# Patient Record
Sex: Male | Born: 1955 | Race: White | Hispanic: No | Marital: Married | Smoking: Former smoker
Health system: Southern US, Community
[De-identification: ages and names within clinical notes are randomized; demographics above are authoritative.]

## PROBLEM LIST (undated history)

## (undated) DIAGNOSIS — K219 Gastro-esophageal reflux disease without esophagitis: Secondary | ICD-10-CM

## (undated) DIAGNOSIS — Z8619 Personal history of other infectious and parasitic diseases: Secondary | ICD-10-CM

## (undated) DIAGNOSIS — M109 Gout, unspecified: Secondary | ICD-10-CM

## (undated) DIAGNOSIS — I10 Essential (primary) hypertension: Secondary | ICD-10-CM

## (undated) DIAGNOSIS — G5600 Carpal tunnel syndrome, unspecified upper limb: Secondary | ICD-10-CM

## (undated) DIAGNOSIS — E119 Type 2 diabetes mellitus without complications: Secondary | ICD-10-CM

## (undated) DIAGNOSIS — M1990 Unspecified osteoarthritis, unspecified site: Secondary | ICD-10-CM

## (undated) HISTORY — DX: Type 2 diabetes mellitus without complications: E11.9

## (undated) HISTORY — DX: Unspecified osteoarthritis, unspecified site: M19.90

## (undated) HISTORY — DX: Gastro-esophageal reflux disease without esophagitis: K21.9

## (undated) HISTORY — DX: Gout, unspecified: M10.9

## (undated) HISTORY — DX: Carpal tunnel syndrome, unspecified upper limb: G56.00

## (undated) HISTORY — DX: Essential (primary) hypertension: I10

## (undated) HISTORY — DX: Personal history of other infectious and parasitic diseases: Z86.19

---
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
## 1979-06-04 HISTORY — PX: OTHER SURGICAL HISTORY: SHX169

## 2001-06-03 DIAGNOSIS — I10 Essential (primary) hypertension: Secondary | ICD-10-CM | POA: Insufficient documentation

## 2007-10-22 DIAGNOSIS — K219 Gastro-esophageal reflux disease without esophagitis: Secondary | ICD-10-CM | POA: Insufficient documentation

## 2008-11-10 DIAGNOSIS — G5600 Carpal tunnel syndrome, unspecified upper limb: Secondary | ICD-10-CM

## 2008-11-10 HISTORY — DX: Carpal tunnel syndrome, unspecified upper limb: G56.00

## 2010-06-18 ENCOUNTER — Ambulatory Visit: Payer: Self-pay | Admitting: Family Medicine

## 2012-03-24 LAB — HM HEPATITIS C SCREENING LAB: HM Hepatitis Screen: NEGATIVE

## 2012-05-14 LAB — HM COLONOSCOPY

## 2013-06-02 ENCOUNTER — Ambulatory Visit: Payer: Self-pay | Admitting: Family Medicine

## 2013-06-03 ENCOUNTER — Ambulatory Visit: Payer: Self-pay | Admitting: Family Medicine

## 2014-05-12 LAB — HEPATIC FUNCTION PANEL
ALT: 26 U/L (ref 10–40)
AST: 22 U/L (ref 14–40)

## 2014-05-12 LAB — BASIC METABOLIC PANEL
BUN: 18 mg/dL (ref 4–21)
Creatinine: 1 mg/dL (ref 0.6–1.3)
Glucose: 101 mg/dL
Potassium: 4.3 mmol/L (ref 3.4–5.3)
Sodium: 140 mmol/L (ref 137–147)

## 2014-05-12 LAB — HEMOGLOBIN A1C: HEMOGLOBIN A1C: 5.8 % (ref 4.0–6.0)

## 2014-05-12 LAB — LIPID PANEL
Cholesterol: 157 mg/dL (ref 0–200)
HDL: 44 mg/dL (ref 35–70)
LDL Cholesterol: 89 mg/dL
Triglycerides: 122 mg/dL (ref 40–160)

## 2014-05-12 LAB — PSA: PSA: 0.9

## 2014-05-12 LAB — TSH: TSH: 2.37 u[IU]/mL (ref 0.41–5.90)

## 2014-12-08 ENCOUNTER — Other Ambulatory Visit: Payer: Self-pay | Admitting: Family Medicine

## 2015-04-05 ENCOUNTER — Other Ambulatory Visit: Payer: Self-pay | Admitting: Family Medicine

## 2015-05-12 DIAGNOSIS — E119 Type 2 diabetes mellitus without complications: Secondary | ICD-10-CM | POA: Insufficient documentation

## 2015-05-12 DIAGNOSIS — L918 Other hypertrophic disorders of the skin: Secondary | ICD-10-CM | POA: Insufficient documentation

## 2015-05-12 DIAGNOSIS — R634 Abnormal weight loss: Secondary | ICD-10-CM | POA: Insufficient documentation

## 2015-05-12 DIAGNOSIS — N529 Male erectile dysfunction, unspecified: Secondary | ICD-10-CM | POA: Insufficient documentation

## 2015-05-15 ENCOUNTER — Encounter: Payer: Self-pay | Admitting: Family Medicine

## 2015-05-15 ENCOUNTER — Ambulatory Visit (INDEPENDENT_AMBULATORY_CARE_PROVIDER_SITE_OTHER): Payer: 59 | Admitting: Family Medicine

## 2015-05-15 VITALS — BP 132/70 | HR 64 | Temp 98.9°F | Resp 16 | Ht 69.5 in | Wt 207.0 lb

## 2015-05-15 DIAGNOSIS — K219 Gastro-esophageal reflux disease without esophagitis: Secondary | ICD-10-CM | POA: Diagnosis not present

## 2015-05-15 DIAGNOSIS — E119 Type 2 diabetes mellitus without complications: Secondary | ICD-10-CM | POA: Diagnosis not present

## 2015-05-15 DIAGNOSIS — E669 Obesity, unspecified: Secondary | ICD-10-CM

## 2015-05-15 DIAGNOSIS — Z0000 Encounter for general adult medical examination without abnormal findings: Secondary | ICD-10-CM

## 2015-05-15 DIAGNOSIS — E785 Hyperlipidemia, unspecified: Secondary | ICD-10-CM | POA: Diagnosis not present

## 2015-05-15 DIAGNOSIS — I10 Essential (primary) hypertension: Secondary | ICD-10-CM

## 2015-05-15 LAB — POCT UA - MICROALBUMIN: MICROALBUMIN (UR) POC: 20 mg/L

## 2015-05-15 MED ORDER — METFORMIN HCL ER 500 MG PO TB24: 500.0000 mg | ORAL_TABLET | Freq: Every day | ORAL | Status: DC

## 2015-05-15 NOTE — Progress Notes (Signed)
Patient: Jeffery Norman, Male    DOB: March 29, 1956, 59 y.o.   MRN: [id number] Visit Date: 05/15/2015  Today's Provider: Mila Merry, MD   Chief Complaint  Patient presents with  . Annual Exam  . Diabetes    follow up  . Hypertension  . Hyperlipidemia   Subjective:    Annual physical exam Jeffery Norman is a 59 y.o. male who presents today for health maintenance and complete physical. He feels well. He reports exercising . He reports he is sleeping well.  -----------------------------------------------------------------  Diabetes Mellitus Type II, Follow-up:   Lab Results  Component Value Date   HGBA1C 5.8 05/12/2014   Last seen for diabetes 1 years ago.  Management since then includes no changes. He reports good compliance with treatment. He has cut back metformin to one a day  Since his blood sugars were so well controlled.  He is not having side effects.  Current symptoms include none and have been stable. Home blood sugar records: fasting range: 90-100   Episodes of hypoglycemia? no   Current Insulin Regimen: none Most Recent Eye Exam: 1 year Weight trend: decreasing steadily Prior visit with dietician: no Current diet: in general, a "healthy" diet   Current exercise: jogging  ------------------------------------------------------------------------   Hypertension, follow-up:  BP Readings from Last 3 Encounters:  05/12/14 138/80    He was last seen for hypertension 1 years ago.  BP at that visit was 138/80. Management since that visit includes no changes .He reports good compliance with treatment. He is not having side effects.  He is exercising. He is not adherent to low salt diet.   Outside blood pressures are not being checked. He is experiencing none.  Patient denies chest pain, chest pressure/discomfort, claudication, dyspnea, exertional chest pressure/discomfort, fatigue, irregular heart beat, lower extremity edema, near-syncope,  orthopnea, paroxysmal nocturnal dyspnea, syncope and tachypnea.   Cardiovascular risk factors include advanced age (older than 76 for men, 32 for women), diabetes mellitus, dyslipidemia, hypertension and male gender.  Use of agents associated with hypertension: NSAIDS.   ------------------------------------------------------------------------    Lipid/Cholesterol, Follow-up:   Last seen for this 3 months ago.  Management since that visit includes no changes.  Last Lipid Panel:    Component Value Date/Time   CHOL 157 05/12/2014   TRIG 122 05/12/2014   HDL 44 05/12/2014   LDLCALC 89 05/12/2014    He reports good compliance with treatment. He is not having side effects.   Wt Readings from Last 3 Encounters:  05/12/14 224 lb (101.606 kg)    ------------------------------------------------------------------------   GERD, Follow up:  He states he has stopped taking omeprazole since symptoms have mostly resolved with weight loss. He does have mild heartburn once or twice a month.He usually takes a tums and symptoms resolve.   ------------------------------------------------------------------------     Review of Systems  Constitutional: Negative for fever, chills, appetite change and fatigue.  HENT: Negative for congestion, ear pain, hearing loss, nosebleeds and trouble swallowing.   Eyes: Negative for pain and visual disturbance.  Respiratory: Negative for cough, chest tightness, shortness of breath and wheezing.   Cardiovascular: Negative for chest pain, palpitations and leg swelling.  Gastrointestinal: Negative for nausea, vomiting, abdominal pain, diarrhea, constipation and blood in stool.  Endocrine: Negative for polydipsia, polyphagia and polyuria.  Genitourinary: Negative for dysuria and flank pain.  Musculoskeletal: Negative for myalgias, back pain, joint swelling, arthralgias and neck stiffness.  Skin: Negative for color change, rash and wound.  Neurological:  Negative for dizziness, tremors, seizures, speech difficulty, weakness, light-headedness and headaches.  Psychiatric/Behavioral: Negative for behavioral problems, confusion, sleep disturbance, dysphoric mood and decreased concentration. The patient is not nervous/anxious.   All other systems reviewed and are negative.   Social History      He  reports that he quit smoking about 3 years ago. His smoking use included Cigarettes. He has a 12.5 pack-year smoking history. He has never used smokeless tobacco. He reports that he drinks alcohol. He reports that he does not use illicit drugs.       Social History   Social History  . Marital Status: Married    Spouse Name: N/A  . Number of Children: 1  . Years of Education: N/A   Occupational History  . Machinist    Social History Main Topics  . Smoking status: Former Smoker -- 0.50 packs/day for 25 years    Types: Cigarettes    Quit date: 08/02/2011  . Smokeless tobacco: Never Used  . Alcohol Use: 0.0 oz/week    0 Standard drinks or equivalent per week     Comment: moderate use  . Drug Use: No  . Sexual Activity: Not Asked   Other Topics Concern  . None   Social History Narrative    Patient Active Problem List   Diagnosis Date Noted  . Diabetes mellitus (HCC) 05/12/2015  . 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 Fatigue 05/12/2015  . Obesity 05/12/2015  . Skin tag 05/12/2015  . Weight loss 05/12/2015  . Carpal tunnel syndrome 11/10/2008  . GERD (gastroesophageal reflux disease) 10/22/2007  . Compulsive tobacco user syndrome 10/22/2007  . Gout 02/03/2007  . HLD (hyperlipidemia) 09/24/2005  . Essential (primary) hypertension 06/03/2001    Past Surgical History  Procedure Laterality Date  . Index finger surgery  1981  . Appendectomy  1960's    Family History        Family Status  Relation Status Death Age  . Mother Deceased 4670    septic shock  . Father Deceased 1632    accidental  . Brother Alive    . Son Alive   . Maternal Grandmother Deceased 8090's        His family history includes Diabetes in his brother, mother, and other; Heart attack in his maternal grandmother; Hypertension in his mother.    No Known Allergies  Previous Medications   ASPIRIN 81 MG TABLET    Take 1 tablet by mouth daily.   ATORVASTATIN (LIPITOR) 40 MG TABLET    Take 1 tablet by mouth daily.   BISOPROLOL (ZEBETA) 10 MG TABLET    TAKE 1 TABLET BY MOUTH ONCE DAILY   GLUCOSE BLOOD (BAYER CONTOUR NEXT TEST) TEST STRIP    1 strip 2 (two) times daily.   METFORMIN (GLUCOPHAGE-XR) 500 MG 24 HR TABLET    TAKE 1 TABLET BY MOUTH DAILY   OMEGA-3 FATTY ACIDS (FISH OIL) 1000 MG CAPS    Take 2 capsules by mouth daily.   SILDENAFIL (VIAGRA) 100 MG TABLET    Take 1 tablet by mouth as needed.    Patient Care Team: Malva Limesonald E Eliyanna Ault, MD as PCP - General (Family Medicine)     Objective:   Vitals: BP 132/70 mmHg  Pulse 64  Temp(Src) 98.9 F (37.2 C) (Oral)  Resp 16  Ht 5' 9.5" (1.765 m)  Wt 207 lb (93.895 kg)  BMI 30.14 kg/m2   Physical Exam   General Appearance:  Alert, cooperative, no distress, appears stated age, overweight  Head:    Normocephalic, without obvious abnormality, atraumatic  Eyes:    PERRL, conjunctiva/corneas clear, EOM's intact, fundi    benign, both eyes       Ears:    Normal TM's and external ear canals, both ears  Nose:   Nares normal, septum midline, mucosa normal, no drainage   or sinus tenderness  Throat:   Lips, mucosa, and tongue normal; teeth and gums normal  Neck:   Supple, symmetrical, trachea midline, no adenopathy;       thyroid:  No enlargement/tenderness/nodules; no carotid   bruit or JVD  Back:     Symmetric, no curvature, ROM normal, no CVA tenderness  Lungs:     Clear to auscultation bilaterally, respirations unlabored  Chest wall:    No tenderness or deformity  Heart:    Regular rate and rhythm, S1 and S2 normal, no murmur, rub   or gallop  Abdomen:     Soft, non-tender,  bowel sounds active all four quadrants,    no masses, no organomegaly  Genitalia:    deferred  Rectal:    deferred  Extremities:   Extremities normal, atraumatic, no cyanosis or edema  Pulses:   2+ and symmetric all extremities  Skin:   Skin color, texture, turgor normal, no rashes or lesions  Lymph nodes:   Cervical, supraclavicular, and axillary nodes normal  Neurologic:   CNII-XII intact. Normal strength, sensation and reflexes      throughout      Depression Screen PHQ 2/9 Scores 05/15/2015  PHQ - 2 Score 0  PHQ- 9 Score 1   Results for orders placed or performed in visit on 05/15/15  POCT UA - Microalbumin  Result Value Ref Range   Microalbumin Ur, POC 20 mg/L   Creatinine, POC n/a mg/dL   Albumin/Creatinine Ratio, Urine, POC n/a       Assessment & Plan:     Routine Health Maintenance and Physical Exam  Exercise Activities and Dietary recommendations Goals    None      Immunization History  Administered Date(s) Administered  . Td 03/28/1997  . Tdap 10/22/2007        Discussed health benefits of physical activity, and encouraged him to engage in regular exercise appropriate for his age and condition.    --------------------------------------------------------------------  1. Annual physical exam Doing. Counseled he is due for routine eye exam  - Comprehensive metabolic panel - PSA  2. Controlled type 2 diabetes mellitus without complication, without long-term current use of insulin (HCC) Doing well, has cut back to one metformin daily. Check a1c.  - POCT UA - Microalbumin - Hemoglobin A1c  3. HLD (hyperlipidemia) He is tolerating atorvastatin well with no adverse effects.   - Lipid panel  4. Essential (primary) hypertension Well controlled.  Continue current medications.   - EKG 12-Lead  5. Obesity Doing well with healthy diet and exercise.   6. Gastroesophageal reflux disease without esophagitis Well controlled with diet alone. Is off of  PPI. Advised to restart if he starts having sx more than twice a week.

## 2015-05-16 LAB — COMPREHENSIVE METABOLIC PANEL
ALK PHOS: 78 IU/L (ref 39–117)
ALT: 22 IU/L (ref 0–44)
AST: 26 IU/L (ref 0–40)
Albumin/Globulin Ratio: 1.7 (ref 1.1–2.5)
Albumin: 4.4 g/dL (ref 3.5–5.5)
BILIRUBIN TOTAL: 0.6 mg/dL (ref 0.0–1.2)
BUN / CREAT RATIO: 20 (ref 9–20)
BUN: 19 mg/dL (ref 6–24)
CHLORIDE: 99 mmol/L (ref 96–106)
CO2: 26 mmol/L (ref 18–29)
CREATININE: 0.96 mg/dL (ref 0.76–1.27)
Calcium: 9.5 mg/dL (ref 8.7–10.2)
GFR calc Af Amer: 101 mL/min/{1.73_m2} (ref >59)
GFR calc non Af Amer: 87 mL/min/{1.73_m2} (ref >59)
Globulin, Total: 2.6 g/dL (ref 1.5–4.5)
Glucose: 111 mg/dL — ABNORMAL HIGH (ref 65–99)
Potassium: 4.6 mmol/L (ref 3.5–5.2)
SODIUM: 139 mmol/L (ref 134–144)
Total Protein: 7 g/dL (ref 6.0–8.5)

## 2015-05-16 LAB — HEMOGLOBIN A1C
Est. average glucose Bld gHb Est-mCnc: 117 mg/dL
HEMOGLOBIN A1C: 5.7 % — AB (ref 4.8–5.6)

## 2015-05-16 LAB — LIPID PANEL
CHOL/HDL RATIO: 2.6 ratio (ref 0.0–5.0)
CHOLESTEROL TOTAL: 166 mg/dL (ref 100–199)
HDL: 63 mg/dL (ref >39)
LDL Calculated: 92 mg/dL (ref 0–99)
TRIGLYCERIDES: 54 mg/dL (ref 0–149)
VLDL Cholesterol Cal: 11 mg/dL (ref 5–40)

## 2015-05-16 LAB — PSA: PROSTATE SPECIFIC AG, SERUM: 1 ng/mL (ref 0.0–4.0)

## 2015-06-02 ENCOUNTER — Other Ambulatory Visit: Payer: Self-pay | Admitting: Family Medicine

## 2015-06-30 ENCOUNTER — Other Ambulatory Visit: Payer: Self-pay | Admitting: Family Medicine

## 2015-07-10 ENCOUNTER — Other Ambulatory Visit: Payer: Self-pay | Admitting: Family Medicine

## 2015-09-21 ENCOUNTER — Other Ambulatory Visit: Payer: Self-pay | Admitting: Family Medicine

## 2015-10-07 ENCOUNTER — Other Ambulatory Visit: Payer: Self-pay | Admitting: Family Medicine

## 2015-12-22 ENCOUNTER — Other Ambulatory Visit: Payer: Self-pay | Admitting: Family Medicine

## 2016-01-18 ENCOUNTER — Other Ambulatory Visit: Payer: Self-pay | Admitting: Family Medicine

## 2016-02-01 ENCOUNTER — Other Ambulatory Visit: Payer: Self-pay | Admitting: Family Medicine

## 2016-05-20 ENCOUNTER — Ambulatory Visit (INDEPENDENT_AMBULATORY_CARE_PROVIDER_SITE_OTHER): Payer: 59 | Admitting: Family Medicine

## 2016-05-20 ENCOUNTER — Encounter: Payer: Self-pay | Admitting: Family Medicine

## 2016-05-20 VITALS — BP 120/72 | HR 60 | Temp 98.8°F | Resp 16 | Ht 70.0 in | Wt 230.0 lb

## 2016-05-20 DIAGNOSIS — I10 Essential (primary) hypertension: Secondary | ICD-10-CM

## 2016-05-20 DIAGNOSIS — Z6833 Body mass index (BMI) 33.0-33.9, adult: Secondary | ICD-10-CM

## 2016-05-20 DIAGNOSIS — E785 Hyperlipidemia, unspecified: Secondary | ICD-10-CM

## 2016-05-20 DIAGNOSIS — Z125 Encounter for screening for malignant neoplasm of prostate: Secondary | ICD-10-CM | POA: Diagnosis not present

## 2016-05-20 DIAGNOSIS — Z0001 Encounter for general adult medical examination with abnormal findings: Secondary | ICD-10-CM | POA: Diagnosis not present

## 2016-05-20 DIAGNOSIS — E669 Obesity, unspecified: Secondary | ICD-10-CM

## 2016-05-20 DIAGNOSIS — Z0000 Encounter for general adult medical examination without abnormal findings: Secondary | ICD-10-CM

## 2016-05-20 DIAGNOSIS — N529 Male erectile dysfunction, unspecified: Secondary | ICD-10-CM

## 2016-05-20 DIAGNOSIS — E118 Type 2 diabetes mellitus with unspecified complications: Secondary | ICD-10-CM | POA: Diagnosis not present

## 2016-05-20 LAB — POCT UA - MICROALBUMIN: MICROALBUMIN (UR) POC: 50 mg/L

## 2016-05-20 MED ORDER — SILDENAFIL CITRATE 20 MG PO TABS: ORAL_TABLET | ORAL | 3 refills | Status: DC

## 2016-05-20 NOTE — Progress Notes (Signed)
Patient: Jeffery Norman, Male    DOB: 1955-07-13, 60 y.o.   MRN: [id number] Visit Date: 05/20/2016  Today's Provider: Mila Merryonald Donella Pascarella, MD   Chief Complaint  Patient presents with  . Annual Exam  . Diabetes  . Hypertension  . Hyperlipidemia   Subjective:    Annual physical exam Jeffery Norman is a 60 y.o. male who presents today for health maintenance and complete physical. He feels fairly well. He reports exercising 3 times weekly . He reports he is sleeping well. HHas no new complaints today.   Colonoscopy- 05/14/2012. Diverticulosis. No polyps.  Tdap- 10/22/2007.     Diabetes Mellitus Type II, Follow-up:   Lab Results  Component Value Date   HGBA1C 5.7 (H) 05/15/2015   HGBA1C 5.8 05/12/2014    Last seen for diabetes 1 years ago.  Management since then includes no changes. He reports good compliance with treatment. He is not having side effects.  Current symptoms include none and have been stable. Home blood sugar records: fasting range: 115  Episodes of hypoglycemia? no   Current Insulin Regimen: none Most Recent Eye Exam: due Weight trend: increasing steadily Prior visit with dietician: no Current diet: well balanced Current exercise: walking  Pertinent Labs:    Component Value Date/Time   CHOL 166 05/15/2015 1003   TRIG 54 05/15/2015 1003   HDL 63 05/15/2015 1003   LDLCALC 92 05/15/2015 1003   CREATININE 0.96 05/15/2015 1003    Wt Readings from Last 3 Encounters:  05/20/16 230 lb (104.3 kg)  05/15/15 207 lb (93.9 kg)  05/12/14 224 lb (101.6 kg)    Hypertension, follow-up:  BP Readings from Last 3 Encounters:  05/20/16 120/72  05/15/15 132/70  05/12/14 138/80    He was last seen for hypertension 1 years ago.  BP at that visit was 132/70. Management since that visit includes no changes. He reports good compliance with treatment. He is not having side effects.  He is exercising. He is adherent to low salt diet.   Outside blood  pressures are not being checked. Patient denies exertional chest pressure/discomfort, lower extremity edema, palpitations and syncope.   Cardiovascular risk factors include diabetes mellitus and dyslipidemia.     Weight trend: increasing steadily  Wt Readings from Last 3 Encounters:  05/20/16 230 lb (104.3 kg)  05/15/15 207 lb (93.9 kg)  05/12/14 224 lb (101.6 kg)    Current diet: well balanced    Lipid/Cholesterol, Follow-up:   Last seen for this1 years ago.  Management changes since that visit include no changes. . Last Lipid Panel:    Component Value Date/Time   CHOL 166 05/15/2015 1003   TRIG 54 05/15/2015 1003   HDL 63 05/15/2015 1003   CHOLHDL 2.6 05/15/2015 1003   LDLCALC 92 05/15/2015 1003    Risk factors for vascular disease include diabetes mellitus and hypertension  He reports good compliance with treatment. He is not having side effects.  Current symptoms include none and have been stable. Weight trend: stable Prior visit with dietician: no Current diet: well balanced Current exercise: walking  Wt Readings from Last 3 Encounters:  05/20/16 230 lb (104.3 kg)  05/15/15 207 lb (93.9 kg)  05/12/14 224 lb (101.6 kg)     He requests prescription today for generic sildenafil. Has done well with Viagra with no adverse side effects.   Review of Systems  Constitutional: Negative.   HENT: Negative.   Eyes: Negative.   Respiratory: Negative.  Cardiovascular: Negative.   Gastrointestinal: Negative.   Endocrine: Negative.   Genitourinary: Negative.   Musculoskeletal: Negative.   Skin: Negative.   Allergic/Immunologic: Negative.   Neurological: Negative.   Hematological: Negative.   Psychiatric/Behavioral: Negative.     Social History      He  reports that he quit smoking about 4 years ago. His smoking use included Cigarettes. He has a 12.50 pack-year smoking history. He has never used smokeless tobacco. He reports that he drinks alcohol. He reports  that he does not use drugs.       Social History   Social History  . Marital status: Married    Spouse name: N/A  . Number of children: 1  . Years of education: N/A   Occupational History  . Machinist    Social History Main Topics  . Smoking status: Former Smoker    Packs/day: 0.50    Years: 25.00    Types: Cigarettes    Quit date: 08/02/2011  . Smokeless tobacco: Never Used  . Alcohol use 0.0 oz/week     Comment: moderate use  . Drug use: No  . Sexual activity: Not Asked   Other Topics Concern  . None   Social History Narrative  . None    Past Medical History:  Diagnosis Date  . Carpal tunnel syndrome 11/10/2008  . Gout   . History of chicken pox   . History of measles   . History of mumps      Patient Active Problem List   Diagnosis Date Noted  . Diabetes mellitus (HCC) 05/12/2015  . 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 Obesity 05/12/2015  . Skin tag 05/12/2015  . Weight loss 05/12/2015  . GERD (gastroesophageal reflux disease) 10/22/2007  . History of tobacco use 10/22/2007  . Gout 02/03/2007  . HLD (hyperlipidemia) 09/24/2005  . Essential (primary) hypertension 06/03/2001    Past Surgical History:  Procedure Laterality Date  . APPENDECTOMY  1960's  . index finger surgery  1981    Family History        Family Status  Relation Status  . Mother Deceased at age 58   septic shock  . Father Deceased at age 58   accidental  . Brother Alive  . Son Alive  . Maternal Grandmother Deceased at age 58's  . Other         His family history includes Diabetes in his brother, mother, and other; Heart attack in his maternal grandmother; Hypertension in his mother.     No Known Allergies   Current Outpatient Prescriptions:  .  aspirin 81 MG tablet, Take 1 tablet by mouth daily., Disp: , Rfl:  .  atorvastatin (LIPITOR) 40 MG tablet, TAKE 1 TABLET BY MOUTH EVERY DAY(NEEDS APPT), Disp: 30 tablet, Rfl: 5 .  bisoprolol (ZEBETA) 10 MG  tablet, TAKE 1 TABLET BY MOUTH ONCE DAILY, Disp: 30 tablet, Rfl: 3 .  glucose blood (BAYER CONTOUR NEXT TEST) test strip, 1 strip daily. , Disp: , Rfl:  .  metFORMIN (GLUCOPHAGE-XR) 500 MG 24 hr tablet, TAKE 1 TABLET BY MOUTH TWICE DAILY, Disp: 60 tablet, Rfl: 5 .  Omega-3 Fatty Acids (FISH OIL) 1000 MG CAPS, Take 2 capsules by mouth daily., Disp: , Rfl:  .  omeprazole (PRILOSEC) 40 MG capsule, TAKE 1 CAPSULE BY MOUTH DAILY, Disp: 30 capsule, Rfl: 3 .  VIAGRA 100 MG tablet, TAKE 1 TABLET BY MOUTH AS NEEDED. NO MORE THAN ONE IN A DAY, Disp: 10  tablet, Rfl: 5   Patient Care Team: Malva Limes, MD as PCP - General (Family Medicine)      Objective:   Vitals: BP 120/72 (BP Location: Left Arm, Patient Position: Sitting, Cuff Size: Normal)   Pulse 60   Temp 98.8 F (37.1 C)   Resp 16   Ht 5\' 10"  (1.778 m)   Wt 230 lb (104.3 kg)   SpO2 98%   BMI 33.00 kg/m    Physical Exam   Depression Screen PHQ 2/9 Scores 05/15/2015  PHQ - 2 Score 0  PHQ- 9 Score 1      Assessment & Plan:     Routine Health Maintenance and Physical Exam  Exercise Activities and Dietary recommendations Goals    None      Immunization History  Administered Date(s) Administered  . Td 03/28/1997  . Tdap 10/22/2007    Health Maintenance  Topic Date Due  . PNEUMOCOCCAL POLYSACCHARIDE VACCINE (1) 03/02/1960  . FOOT EXAM  03/02/1968  . OPHTHALMOLOGY EXAM  03/02/1968  . HIV Screening  03/02/1973  . HEMOGLOBIN A1C  11/13/2015  . INFLUENZA VACCINE  01/02/2016  . URINE MICROALBUMIN  05/14/2016  . TETANUS/TDAP  10/21/2017  . COLONOSCOPY  05/14/2022  . Hepatitis C Screening  Completed     Discussed health benefits of physical activity, and encouraged him to engage in regular exercise appropriate for his age and condition.   1. Annual physical exam Generally doing well with normal except for obesity.  - Comprehensive metabolic panel - Lipid panel - TSH  2. Essential (primary)  hypertension Well controlled.  Continue current medications.    3. Type 2 diabetes mellitus with complication, unspecified long term insulin use status (HCC) Doing well with metformin - Hemoglobin A1c - POCT UA - Microalbumin  4. ED (erectile dysfunction) of organic origin Change from Viagra to generic sildenafil 20mg  due to cost.  5. Hyperlipidemia, unspecified hyprlipidemia type He is tolerating atorvastatin and Zetia. well with no adverse effects.   - Lipid panel - TSH  6. Prostate cancer screening  - PSA  7. Class 1 obesity without serious comorbidity with body mass index (BMI) of 33.0 to 33.9 in adult, unspecified obesity type Diet and exercise.      The entirety of the information documented in the History of Present Illness, Review of Systems and Physical Exam were personally obtained by me. Portions of this information were initially documented by Anson [place], CMA and reviewed by me for thoroughness and accuracy.    Mila Merry, MD  [facility]

## 2016-05-21 ENCOUNTER — Other Ambulatory Visit: Payer: Self-pay | Admitting: Family Medicine

## 2016-05-21 DIAGNOSIS — R972 Elevated prostate specific antigen [PSA]: Secondary | ICD-10-CM | POA: Insufficient documentation

## 2016-05-21 LAB — COMPREHENSIVE METABOLIC PANEL
A/G RATIO: 1.5 (ref 1.2–2.2)
ALT: 23 IU/L (ref 0–44)
AST: 24 IU/L (ref 0–40)
Albumin: 4.4 g/dL (ref 3.5–5.5)
Alkaline Phosphatase: 78 IU/L (ref 39–117)
BUN/Creatinine Ratio: 18 (ref 9–20)
BUN: 17 mg/dL (ref 6–24)
Bilirubin Total: 0.5 mg/dL (ref 0.0–1.2)
CALCIUM: 9.1 mg/dL (ref 8.7–10.2)
CO2: 26 mmol/L (ref 18–29)
Chloride: 101 mmol/L (ref 96–106)
Creatinine, Ser: 0.93 mg/dL (ref 0.76–1.27)
GFR, EST AFRICAN AMERICAN: 104 mL/min/{1.73_m2} (ref >59)
GFR, EST NON AFRICAN AMERICAN: 90 mL/min/{1.73_m2} (ref >59)
Globulin, Total: 2.9 g/dL (ref 1.5–4.5)
Glucose: 101 mg/dL — ABNORMAL HIGH (ref 65–99)
POTASSIUM: 4.8 mmol/L (ref 3.5–5.2)
Sodium: 142 mmol/L (ref 134–144)
TOTAL PROTEIN: 7.3 g/dL (ref 6.0–8.5)

## 2016-05-21 LAB — LIPID PANEL
CHOL/HDL RATIO: 3.3 ratio (ref 0.0–5.0)
Cholesterol, Total: 154 mg/dL (ref 100–199)
HDL: 46 mg/dL (ref >39)
LDL Calculated: 90 mg/dL (ref 0–99)
Triglycerides: 92 mg/dL (ref 0–149)
VLDL CHOLESTEROL CAL: 18 mg/dL (ref 5–40)

## 2016-05-21 LAB — PSA: PROSTATE SPECIFIC AG, SERUM: 5.7 ng/mL — AB (ref 0.0–4.0)

## 2016-05-21 LAB — TSH: TSH: 1.86 u[IU]/mL (ref 0.450–4.500)

## 2016-05-21 LAB — HEMOGLOBIN A1C
Est. average glucose Bld gHb Est-mCnc: 114 mg/dL
HEMOGLOBIN A1C: 5.6 % (ref 4.8–5.6)

## 2016-06-02 ENCOUNTER — Other Ambulatory Visit: Payer: Self-pay | Admitting: Family Medicine

## 2016-06-27 ENCOUNTER — Telehealth: Payer: Self-pay | Admitting: Family Medicine

## 2016-06-27 DIAGNOSIS — R972 Elevated prostate specific antigen [PSA]: Secondary | ICD-10-CM

## 2016-06-27 NOTE — Telephone Encounter (Signed)
Please advise 

## 2016-06-27 NOTE — Telephone Encounter (Signed)
Patient notified lab slip is ready to pick-up.  

## 2016-06-27 NOTE — Telephone Encounter (Signed)
Future order is already in epic. Please release for patient to pick up.

## 2016-06-27 NOTE — Telephone Encounter (Signed)
Pt wife called to request a lab slip for pt to have his PSA retested.  [id number]#[phone]/WJCB#[phone]/MW

## 2016-06-28 LAB — PSA, TOTAL AND FREE
PROSTATE SPECIFIC AG, SERUM: 3.5 ng/mL (ref 0.0–4.0)
PSA, Free Pct: 9.4 %
PSA, Free: 0.33 ng/mL

## 2016-07-24 ENCOUNTER — Other Ambulatory Visit: Payer: Self-pay | Admitting: Family Medicine

## 2016-09-20 ENCOUNTER — Other Ambulatory Visit: Payer: Self-pay | Admitting: Specialist

## 2016-09-23 ENCOUNTER — Ambulatory Visit (INDEPENDENT_AMBULATORY_CARE_PROVIDER_SITE_OTHER): Payer: Managed Care, Other (non HMO) | Admitting: Family Medicine

## 2016-09-23 ENCOUNTER — Encounter: Payer: Self-pay | Admitting: Family Medicine

## 2016-09-23 VITALS — BP 140/82 | HR 52 | Temp 98.6°F | Resp 16 | Ht 70.0 in | Wt 242.0 lb

## 2016-09-23 DIAGNOSIS — E118 Type 2 diabetes mellitus with unspecified complications: Secondary | ICD-10-CM

## 2016-09-23 DIAGNOSIS — I10 Essential (primary) hypertension: Secondary | ICD-10-CM | POA: Diagnosis not present

## 2016-09-23 DIAGNOSIS — R972 Elevated prostate specific antigen [PSA]: Secondary | ICD-10-CM | POA: Diagnosis not present

## 2016-09-23 DIAGNOSIS — Z01818 Encounter for other preprocedural examination: Secondary | ICD-10-CM | POA: Diagnosis not present

## 2016-09-23 NOTE — Progress Notes (Signed)
Patient: Jeffery Norman Male    DOB: 04/12/1956   61 y.o.   MRN: [id number] Visit Date: 09/23/2016  Today's Provider: Mila Merry, MD   Chief Complaint  Patient presents with  . Medical Clearance    Surgical clearance   Subjective:    HPI Surgical Clearance:  Patient is scheduled to have Left Carpal Tunnel surgery on 10/02/2016. Surgeon will be Dr. Deeann Saint. Patient feels well today. He has no chest pain, palpitations, dizziness, orthopnea, or dyspnea on exertion.  Past Surgical History:  Procedure Laterality Date  . APPENDECTOMY  1960's  . index finger surgery  1981   Has no personal or family history of adverse reaction to anesthesia or significant surgical complications.   Patient Active Problem List   Diagnosis Date Noted  . Elevated PSA 05/21/2016  . Diabetes mellitus (HCC) 05/12/2015  . 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 Obesity 05/12/2015  . Skin tag 05/12/2015  . Weight loss 05/12/2015  . GERD (gastroesophageal reflux disease) 10/22/2007  . History of tobacco use 10/22/2007  . Gout 02/03/2007  . HLD (hyperlipidemia) 09/24/2005  . Essential (primary) hypertension 06/03/2001   . Past Medical History:  Diagnosis Date  . Carpal tunnel syndrome 11/10/2008  . Gout   . History of chicken pox   . History of measles   . History of mumps        No Known Allergies   Current Outpatient Prescriptions:  .  aspirin 81 MG tablet, Take 1 tablet by mouth daily., Disp: , Rfl:  .  atorvastatin (LIPITOR) 40 MG tablet, TAKE 1 TABLET BY MOUTH EVERY DAY(NEEDS APPT), Disp: 30 tablet, Rfl: 5 .  bisoprolol (ZEBETA) 10 MG tablet, TAKE 1 TABLET BY MOUTH ONCE DAILY, Disp: 30 tablet, Rfl: 5 .  glucose blood (BAYER CONTOUR NEXT TEST) test strip, 1 strip daily. , Disp: , Rfl:  .  metFORMIN (GLUCOPHAGE-XR) 500 MG 24 hr tablet, TAKE 1 TABLET BY MOUTH TWICE DAILY, Disp: 60 tablet, Rfl: 5 .  Omega-3 Fatty Acids (FISH OIL) 1000 MG CAPS, Take 2  capsules by mouth daily., Disp: , Rfl:  .  omeprazole (PRILOSEC) 40 MG capsule, TAKE 1 CAPSULE BY MOUTH DAILY, Disp: 30 capsule, Rfl: 5 .  sildenafil (REVATIO) 20 MG tablet, 3-5 tablets as needed prior to intercourse, not to exceed 5 tablets in a day, Disp: 50 tablet, Rfl: 3 .  meloxicam (MOBIC) 15 MG tablet, Take 1 tablet by mouth daily., Disp: , Rfl:   Review of Systems  Constitutional: Negative for appetite change, chills and fever.  Respiratory: Negative for chest tightness, shortness of breath and wheezing.   Cardiovascular: Negative for chest pain and palpitations.  Gastrointestinal: Negative for abdominal pain, nausea and vomiting.  Musculoskeletal: Positive for arthralgias (left hand pain).    Social History  Substance Use Topics  . Smoking status: Former Smoker    Packs/day: 0.50    Years: 25.00    Types: Cigarettes    Quit date: 08/02/2011  . Smokeless tobacco: Never Used  . Alcohol use 0.0 oz/week     Comment: moderate use   Objective:   BP 140/82 (BP Location: Left Arm, Patient Position: Sitting, Cuff Size: Large)   Pulse (!) 52   Temp 98.6 F (37 C) (Oral)   Resp 16   Ht  (1.778 m)   Wt 242 lb (109.8 kg)   SpO2 97% Comment: room air  BMI 34.72 kg/m  Physical Exam   General Appearance:    Alert, cooperative, no distress  Eyes:    PERRL, conjunctiva/corneas clear, EOM's intact       Lungs:     Clear to auscultation bilaterally, respirations unlabored  Heart:    Regular rate and rhythm, nor mrg.   Neurologic:   Awake, alert, oriented x 3. No apparent focal neurological           defect.       Results for orders placed or performed in visit on 09/23/16  Hemoglobin A1c  Result Value Ref Range   Hgb A1c MFr Bld 5.8 (H) 4.8 - 5.6 %   Est. average glucose Bld gHb Est-mCnc 120 mg/dL  PSA Total (Reflex To Free)  Result Value Ref Range   Prostate Specific Ag, Serum 2.8 0.0 - 4.0 ng/mL   Reflex Criteria Comment         Assessment & Plan:     1.  Pre-op evaluation Risk factors for cardiac disease include well controlled diabetes, hypertension, and hyperlipemia. These are well controlled  And he is currently at low risk for cardiac and pulmonary complications of surgery and anesthesia. He may stop aspirin 5 days before surgery and resume the day following surgery. No additional precautions are indicated.  - EKG 12-Lead  2. Essential (primary) hypertension Well controlled.  Continue current medications.   - EKG 12-Lead  3. Elevated PSA  - PSA Total (Reflex To Free)  4. Type 2 diabetes mellitus with complication, unspecified whether long term insulin use (HCC) Well controlled.  Continue current medications.   - Hemoglobin A1c - EKG 12-Lead       Mila Merry, MD  [facility]

## 2016-09-24 LAB — PSA TOTAL (REFLEX TO FREE): PROSTATE SPECIFIC AG, SERUM: 2.8 ng/mL (ref 0.0–4.0)

## 2016-09-24 LAB — HEMOGLOBIN A1C
ESTIMATED AVERAGE GLUCOSE: 120 mg/dL
HEMOGLOBIN A1C: 5.8 % — AB (ref 4.8–5.6)

## 2016-09-24 NOTE — Progress Notes (Signed)
Advised  ED 

## 2016-09-26 ENCOUNTER — Encounter: Payer: Self-pay | Admitting: *Deleted

## 2016-09-26 ENCOUNTER — Encounter
Admission: RE | Admit: 2016-09-26 | Discharge: 2016-09-26 | Disposition: A | Discharge status: Home / Self Care | Payer: Managed Care, Other (non HMO) | Source: Ambulatory Visit | Attending: Specialist | Admitting: Specialist

## 2016-09-26 NOTE — Patient Instructions (Signed)
  Your procedure is scheduled on: 10-02-16  Report to Same Day Surgery 2nd floor medical mall [facility] Entrance-take elevator on left to 2nd floor.  Check in with surgery information desk.) To find out your arrival time please call [phone] between 1PM - 3PM on 10-01-16  Remember: Instructions that are not followed completely may result in serious medical risk, up to and including death, or upon the discretion of your surgeon and anesthesiologist your surgery may need to be rescheduled.    _x___ 1. Do not eat food or drink liquids after midnight. No gum chewing or  hard candies.     __x__ 2. No Alcohol for 24 hours before or after surgery.   __x__3. No Smoking for 24 prior to surgery.   ____  4. Bring all medications with you on the day of surgery if instructed.    __x__ 5. Notify your doctor if there is any change in your medical condition     (cold, fever, infections).     Do not wear jewelry, make-up, hairpins, clips or nail polish.  Do not wear lotions, powders, or perfumes. You may wear deodorant.  Do not shave 48 hours prior to surgery. Men may shave face and neck.  Do not bring valuables to the hospital.    [facility] is not responsible for any belongings or valuables.               Contacts, dentures or bridgework may not be worn into surgery.  Leave your suitcase in the car. After surgery it may be brought to your room.  For patients admitted to the hospital, discharge time is determined by your treatment team.   Patients discharged the day of surgery will not be allowed to drive home.  You will need someone to drive you home and stay with you the night of your procedure.    Please read over the following fact sheets that you were given:   [facility] Preparing for Surgery and or MRSA Information   _x___ Take anti-hypertensive (unless it includes a diuretic), cardiac, seizure, asthma,     anti-reflux and psychiatric medicines. These include:  1. BISOPROLOL  2.  PRILOSEC  3. TAKE AN EXTRA PRILOSEC ON Tuesday NIGHT BEFORE BED  4.  5.  6.  ____Fleets enema or Magnesium Citrate as directed.   ____ Use CHG Soap or sage wipes as directed on instruction sheet   ____ Use inhalers on the day of surgery and bring to hospital day of surgery  _X___ Stop Metformin and Janumet 2 days prior to surgery-LAST DOSE OF METFORMIN ON Sunday, April 29TH    ____ Take 1/2 of usual insulin dose the night before surgery and none on the morning     surgery.   _x___ Follow recommendations from Cardiologist, Pulmonologist or PCP regarding stopping Aspirin, Coumadin, Pllavix ,Eliquis, Effient, or Pradaxa, and Pletal-STOP ASPIRIN NOW  X____Stop Anti-inflammatories such as Advil, Aleve, Ibuprofen, Motrin, Naproxen,MELOXICAM Naprosyn, Goodies powders or aspirin products NOW-OK to take Tylenol    _x___ Stop supplements until after surgery-STOP FISH OIL NOW   ____ Bring C-Pap to the hospital.

## 2016-10-01 MED ORDER — CLINDAMYCIN PHOSPHATE 600 MG/50ML IV SOLN
600.0000 mg | Freq: Once | INTRAVENOUS | Status: AC
  Administered 2016-10-02: 600 mg via INTRAVENOUS

## 2016-10-01 MED ORDER — CEFAZOLIN SODIUM-DEXTROSE 2-4 GM/100ML-% IV SOLN: 2.0000 g | INTRAVENOUS | Status: AC

## 2016-10-01 NOTE — Pre-Procedure Instructions (Signed)
MEDICAL CLEARANCE ON CHART AND IN EPIC FROM DR Sherrie Mustache

## 2016-10-02 ENCOUNTER — Encounter: Payer: Self-pay | Admitting: *Deleted

## 2016-10-02 ENCOUNTER — Encounter
Admission: RE | Disposition: A | Discharge status: Home / Self Care | Payer: Self-pay | Source: Ambulatory Visit | Attending: Specialist

## 2016-10-02 ENCOUNTER — Ambulatory Visit: Payer: Managed Care, Other (non HMO) | Admitting: Anesthesiology

## 2016-10-02 ENCOUNTER — Ambulatory Visit
Admission: RE | Admit: 2016-10-02 | Discharge: 2016-10-02 | Disposition: A | Discharge status: Home / Self Care | Payer: Managed Care, Other (non HMO) | Source: Ambulatory Visit | Attending: Specialist | Admitting: Specialist

## 2016-10-02 DIAGNOSIS — K219 Gastro-esophageal reflux disease without esophagitis: Secondary | ICD-10-CM | POA: Diagnosis not present

## 2016-10-02 DIAGNOSIS — Z79899 Other long term (current) drug therapy: Secondary | ICD-10-CM | POA: Diagnosis not present

## 2016-10-02 DIAGNOSIS — Z87891 Personal history of nicotine dependence: Secondary | ICD-10-CM | POA: Insufficient documentation

## 2016-10-02 DIAGNOSIS — G5602 Carpal tunnel syndrome, left upper limb: Secondary | ICD-10-CM | POA: Insufficient documentation

## 2016-10-02 DIAGNOSIS — E119 Type 2 diabetes mellitus without complications: Secondary | ICD-10-CM | POA: Diagnosis not present

## 2016-10-02 HISTORY — PX: CARPAL TUNNEL RELEASE: SHX101

## 2016-10-02 LAB — GLUCOSE, CAPILLARY
Glucose-Capillary: 112 mg/dL — ABNORMAL HIGH (ref 65–99)
Glucose-Capillary: 128 mg/dL — ABNORMAL HIGH (ref 65–99)

## 2016-10-02 SURGERY — CARPAL TUNNEL RELEASE
Anesthesia: General | Laterality: Left | Wound class: Clean

## 2016-10-02 MED ORDER — BUPIVACAINE HCL (PF) 0.5 % IJ SOLN
INTRAMUSCULAR | Status: AC
  Filled 2016-10-02: qty 30

## 2016-10-02 MED ORDER — MIDAZOLAM HCL 2 MG/2ML IJ SOLN
INTRAMUSCULAR | Status: DC | PRN
  Administered 2016-10-02: 2 mg via INTRAVENOUS

## 2016-10-02 MED ORDER — LIDOCAINE HCL (CARDIAC) 20 MG/ML IV SOLN
INTRAVENOUS | Status: DC | PRN
  Administered 2016-10-02: 100 mg via INTRAVENOUS

## 2016-10-02 MED ORDER — FENTANYL CITRATE (PF) 100 MCG/2ML IJ SOLN
INTRAMUSCULAR | Status: DC | PRN
  Administered 2016-10-02 (×4): 25 ug via INTRAVENOUS

## 2016-10-02 MED ORDER — PROPOFOL 10 MG/ML IV BOLUS
INTRAVENOUS | Status: AC
  Filled 2016-10-02: qty 20

## 2016-10-02 MED ORDER — SODIUM CHLORIDE 0.9 % IV SOLN
INTRAVENOUS | Status: DC
  Administered 2016-10-02: 10:00:00 via INTRAVENOUS

## 2016-10-02 MED ORDER — GABAPENTIN 400 MG PO CAPS: 400.0000 mg | ORAL_CAPSULE | Freq: Two times a day (BID) | ORAL | 3 refills | Status: DC

## 2016-10-02 MED ORDER — FENTANYL CITRATE (PF) 100 MCG/2ML IJ SOLN: 25.0000 ug | INTRAMUSCULAR | Status: DC | PRN

## 2016-10-02 MED ORDER — CEFAZOLIN SODIUM-DEXTROSE 2-3 GM-% IV SOLR
INTRAVENOUS | Status: DC | PRN
  Administered 2016-10-02: 2 g via INTRAVENOUS

## 2016-10-02 MED ORDER — EPHEDRINE SULFATE 50 MG/ML IJ SOLN
INTRAMUSCULAR | Status: DC | PRN
  Administered 2016-10-02 (×2): 10 mg via INTRAVENOUS

## 2016-10-02 MED ORDER — MELOXICAM 15 MG PO TABS: 15.0000 mg | ORAL_TABLET | Freq: Every day | ORAL | 3 refills | Status: DC

## 2016-10-02 MED ORDER — LIDOCAINE HCL (PF) 2 % IJ SOLN
INTRAMUSCULAR | Status: AC
  Filled 2016-10-02: qty 2

## 2016-10-02 MED ORDER — CHLORHEXIDINE GLUCONATE CLOTH 2 % EX PADS: 6.0000 | MEDICATED_PAD | Freq: Once | CUTANEOUS | Status: DC

## 2016-10-02 MED ORDER — CLINDAMYCIN PHOSPHATE 600 MG/50ML IV SOLN
INTRAVENOUS | Status: AC
  Filled 2016-10-02: qty 50

## 2016-10-02 MED ORDER — GABAPENTIN 400 MG PO CAPS
400.0000 mg | ORAL_CAPSULE | Freq: Once | ORAL | Status: AC
  Administered 2016-10-02: 400 mg via ORAL

## 2016-10-02 MED ORDER — ACETAMINOPHEN 325 MG PO TABS
ORAL_TABLET | ORAL | Status: AC
  Filled 2016-10-02: qty 2

## 2016-10-02 MED ORDER — MELOXICAM 7.5 MG PO TABS
ORAL_TABLET | ORAL | Status: AC
  Filled 2016-10-02: qty 2

## 2016-10-02 MED ORDER — BUPIVACAINE HCL (PF) 0.5 % IJ SOLN
INTRAMUSCULAR | Status: DC | PRN
  Administered 2016-10-02: 17 mL

## 2016-10-02 MED ORDER — PROPOFOL 10 MG/ML IV BOLUS
INTRAVENOUS | Status: DC | PRN
  Administered 2016-10-02: 170 mg via INTRAVENOUS

## 2016-10-02 MED ORDER — CEFAZOLIN SODIUM-DEXTROSE 2-4 GM/100ML-% IV SOLN
INTRAVENOUS | Status: AC
  Filled 2016-10-02: qty 100

## 2016-10-02 MED ORDER — ONDANSETRON HCL 4 MG/2ML IJ SOLN
INTRAMUSCULAR | Status: AC
  Filled 2016-10-02: qty 2

## 2016-10-02 MED ORDER — ACETAMINOPHEN 325 MG PO TABS
650.0000 mg | ORAL_TABLET | Freq: Once | ORAL | Status: AC
  Administered 2016-10-02: 650 mg via ORAL

## 2016-10-02 MED ORDER — FENTANYL CITRATE (PF) 100 MCG/2ML IJ SOLN
INTRAMUSCULAR | Status: AC
  Filled 2016-10-02: qty 2

## 2016-10-02 MED ORDER — HYDROCODONE-ACETAMINOPHEN 5-325 MG PO TABS: 1.0000 | ORAL_TABLET | Freq: Four times a day (QID) | ORAL | 0 refills | Status: DC | PRN

## 2016-10-02 MED ORDER — PROMETHAZINE HCL 25 MG/ML IJ SOLN: 6.2500 mg | INTRAMUSCULAR | Status: DC | PRN

## 2016-10-02 MED ORDER — MIDAZOLAM HCL 2 MG/2ML IJ SOLN
INTRAMUSCULAR | Status: AC
  Filled 2016-10-02: qty 2

## 2016-10-02 MED ORDER — GABAPENTIN 400 MG PO CAPS
ORAL_CAPSULE | ORAL | Status: AC
  Filled 2016-10-02: qty 1

## 2016-10-02 MED ORDER — ONDANSETRON HCL 4 MG/2ML IJ SOLN
INTRAMUSCULAR | Status: DC | PRN
  Administered 2016-10-02: 4 mg via INTRAVENOUS

## 2016-10-02 MED ORDER — GLYCOPYRROLATE 0.2 MG/ML IJ SOLN
INTRAMUSCULAR | Status: DC | PRN
  Administered 2016-10-02: 0.1 mg via INTRAVENOUS

## 2016-10-02 MED ORDER — MELOXICAM 7.5 MG PO TABS
15.0000 mg | ORAL_TABLET | Freq: Once | ORAL | Status: AC
  Administered 2016-10-02: 15 mg via ORAL

## 2016-10-02 SURGICAL SUPPLY — 29 items
BLADE SURG MINI STRL (BLADE) ×3 IMPLANT
BNDG ESMARK 4X12 TAN STRL LF (GAUZE/BANDAGES/DRESSINGS) ×3 IMPLANT
CANISTER SUCT 1200ML W/VALVE (MISCELLANEOUS) ×3 IMPLANT
CHLORAPREP W/TINT 26ML (MISCELLANEOUS) ×3 IMPLANT
CUFF TOURN 18 STER (MISCELLANEOUS) ×3 IMPLANT
ELECT REM PT RETURN 9FT ADLT (ELECTROSURGICAL) ×3
ELECTRODE REM PT RTRN 9FT ADLT (ELECTROSURGICAL) ×1 IMPLANT
GAUZE FLUFF 18X24 1PLY STRL (GAUZE/BANDAGES/DRESSINGS) ×3 IMPLANT
GAUZE PETRO XEROFOAM 1X8 (MISCELLANEOUS) ×3 IMPLANT
GLOVE BIO SURGEON STRL SZ8 (GLOVE) ×3 IMPLANT
GLOVE BIOGEL PI IND STRL 6.5 (GLOVE) ×3 IMPLANT
GLOVE BIOGEL PI IND STRL 7.0 (GLOVE) ×1 IMPLANT
GLOVE BIOGEL PI INDICATOR 6.5 (GLOVE) ×6
GLOVE BIOGEL PI INDICATOR 7.0 (GLOVE) ×2
GLOVE SURG SYN 7.0 (GLOVE) ×9 IMPLANT
GOWN STRL REUS W/ TWL LRG LVL3 (GOWN DISPOSABLE) ×3 IMPLANT
GOWN STRL REUS W/TWL LRG LVL3 (GOWN DISPOSABLE) ×6
GOWN STRL REUS W/TWL LRG LVL4 (GOWN DISPOSABLE) ×3 IMPLANT
KIT RM TURNOVER STRD PROC AR (KITS) ×3 IMPLANT
NS IRRIG 500ML POUR BTL (IV SOLUTION) ×3 IMPLANT
PACK EXTREMITY ARMC (MISCELLANEOUS) ×3 IMPLANT
PAD PREP 24X41 OB/GYN DISP (PERSONAL CARE ITEMS) ×3 IMPLANT
SPLINT CAST 1 STEP 3X12 (MISCELLANEOUS) ×3 IMPLANT
STOCKINETTE BIAS CUT 4 980044 (GAUZE/BANDAGES/DRESSINGS) ×3 IMPLANT
STOCKINETTE STRL 4IN 9604848 (GAUZE/BANDAGES/DRESSINGS) ×3 IMPLANT
SUT ETHILON 4-0 (SUTURE) ×2
SUT ETHILON 4-0 FS2 18XMFL BLK (SUTURE) ×1
SUT ETHILON 5-0 FS-2 18 BLK (SUTURE) IMPLANT
SUTURE ETHLN 4-0 FS2 18XMF BLK (SUTURE) ×1 IMPLANT

## 2016-10-02 NOTE — Transfer of Care (Signed)
Immediate Anesthesia Transfer of Care Note  Patient: Jeffery Norman  Procedure(s) Performed: Procedure(s): CARPAL TUNNEL RELEASE (Left)  Patient Location: PACU  Anesthesia Type:General  Level of Consciousness: sedated  Airway & Oxygen Therapy: Patient connected to face mask oxygen  Post-op Assessment: Post -op Vital signs reviewed and stable  Post vital signs: stable  Last Vitals:  Vitals:   10/02/16 0931 10/02/16 1142  BP: (!) 172/83 121/78  Pulse: (!) 57 (!) 50  Resp:  10  Temp: 36.9 C 36.3 C    Last Pain:  Vitals:   10/02/16 1142  TempSrc: Temporal         Complications: No apparent anesthesia complications

## 2016-10-02 NOTE — Anesthesia Procedure Notes (Signed)
Procedure Name: LMA Insertion Date/Time: 10/02/2016 10:50 AM Performed by: Irving Burton Pre-anesthesia Checklist: Patient identified, Emergency Drugs available, Suction available and Patient being monitored Patient Re-evaluated:Patient Re-evaluated prior to inductionOxygen Delivery Method: Circle system utilized Preoxygenation: Pre-oxygenation with 100% oxygen Intubation Type: IV induction Ventilation: Mask ventilation without difficulty LMA: LMA inserted LMA Size: 4.5 Number of attempts: 1 Placement Confirmation: positive ETCO2 and breath sounds checked- equal and bilateral Tube secured with: Tape Dental Injury: Teeth and Oropharynx as per pre-operative assessment

## 2016-10-02 NOTE — Anesthesia Postprocedure Evaluation (Signed)
Anesthesia Post Note  Patient: Jeffery Norman  Procedure(s) Performed: Procedure(s) (LRB): CARPAL TUNNEL RELEASE (Left)  Patient location during evaluation: PACU Anesthesia Type: General Level of consciousness: awake and alert Pain management: pain level controlled Vital Signs Assessment: post-procedure vital signs reviewed and stable Respiratory status: spontaneous breathing, nonlabored ventilation, respiratory function stable and patient connected to nasal cannula oxygen Cardiovascular status: blood pressure returned to baseline and stable Postop Assessment: no signs of nausea or vomiting Anesthetic complications: no     Last Vitals:  Vitals:   10/02/16 1233 10/02/16 1257  BP: (!) 153/87 (!) 143/76  Pulse: (!) 46 (!) 47  Resp: 16 16  Temp: 36.2 C     Last Pain:  Vitals:   10/02/16 1257  TempSrc:   PainSc: 2                  Lenard Simmer

## 2016-10-02 NOTE — H&P (Signed)
THE PATIENT WAS SEEN PRIOR TO SURGERY TODAY.  HISTORY, ALLERGIES, HOME MEDICATIONS AND OPERATIVE PROCEDURE WERE REVIEWED. RISKS AND BENEFITS OF SURGERY DISCUSSED WITH PATIENT AGAIN.  NO CHANGES FROM INITIAL HISTORY AND PHYSICAL NOTED.    

## 2016-10-02 NOTE — Discharge Instructions (Signed)

## 2016-10-02 NOTE — Anesthesia Preprocedure Evaluation (Addendum)
Anesthesia Evaluation  Patient identified by MRN, date of birth, ID band Patient awake    Reviewed: Allergy & Precautions, H&P , NPO status , Patient's Chart, lab work & pertinent test results, reviewed documented beta blocker date and time   History of Anesthesia Complications Negative for: history of anesthetic complications  Airway Mallampati: II  TM Distance: >3 FB Neck ROM: full    Dental  (+) Dental Advidsory Given, Edentulous Upper, Partial Lower, Upper Dentures, Poor Dentition   Pulmonary neg pulmonary ROS, former smoker,           Cardiovascular Exercise Tolerance: Good hypertension, (-) angina(-) CAD, (-) Past MI, (-) Cardiac Stents and (-) CABG (-) dysrhythmias (-) Valvular Problems/Murmurs     Neuro/Psych neg Seizures  Neuromuscular disease negative psych ROS   GI/Hepatic Neg liver ROS, GERD  ,  Endo/Other  diabetes  Renal/GU negative Renal ROS  negative genitourinary   Musculoskeletal   Abdominal   Peds  Hematology negative hematology ROS (+)   Anesthesia Other Findings Past Medical History: 11/10/2008: Carpal tunnel syndrome No date: Diabetes mellitus without complication (HCC) No date: GERD (gastroesophageal reflux disease) No date: Gout No date: History of chicken pox No date: History of measles No date: History of mumps No date: Hypertension   Reproductive/Obstetrics negative OB ROS                            Anesthesia Physical Anesthesia Plan  ASA: III  Anesthesia Plan: General   Post-op Pain Management:    Induction:   Airway Management Planned:   Additional Equipment:   Intra-op Plan:   Post-operative Plan:   Informed Consent: I have reviewed the patients History and Physical, chart, labs and discussed the procedure including the risks, benefits and alternatives for the proposed anesthesia with the patient or authorized representative who has indicated  his/her understanding and acceptance.   Dental Advisory Given  Plan Discussed with: Anesthesiologist, CRNA and Surgeon  Anesthesia Plan Comments:         Anesthesia Quick Evaluation

## 2016-10-02 NOTE — Anesthesia Post-op Follow-up Note (Cosign Needed)
Anesthesia QCDR form completed.        

## 2016-10-02 NOTE — Op Note (Signed)
10/02/2016  11:34 AM  PATIENT:  Jeffery Norman    PRE-OPERATIVE DIAGNOSIS: LEFT CARPAL TUNNEL SYNDROME  POST-OPERATIVE DIAGNOSIS: LEFT CARPAL TUNNEL SYNDROME  PROCEDURE:  LEFT CARPAL TUNNEL RELEASE  SURGEON: Valinda Hoar, MD  TOURNIQUET TIME: 19  MIN   ANESTHESIA:   General  PREOPERATIVE INDICATIONS:  Jeffery Norman is a  61 y.o. male with a diagnosis of left carpal tunnel syndrome who failed conservative measures and elected for surgical management.    The risks benefits and alternatives were discussed with the patient preoperatively including but not limited to the risks of infection, bleeding, nerve injury, incomplete relief of symptoms, pillar pain, cardiopulmonary complications, the need for revision surgery, among others, and the patient was willing to proceed.  OPERATIVE FINDINGS: Thickened volar ligament and nerve compression.  OPERATIVE PROCEDURE: The patient is brought to the operating room placed in the supine position. General anesthesia was administered. The left upper extremity was prepped and draped in usual sterile fashion. Time out was performed. The arm was elevated and exsanguinated and the tourniquet was inflated. Incision was made in line with the radial border of the ring finger. The carpal tunnel transverse fascia was identified, cleaned, and incised sharply. The common sensory branches were visualized along with the superficial palmar arch and protected.  The median nerve was protected below. A Kelly clamp was  placed underneath the transverse carpal ligament, protecting the nerve. I released the ligament completely, and then released the proximal distal volar forearm fascia. The nerve was identified, and visualized, and protected throughout the case. The motor branch was intact upon inspection. No masses or abnormalities were identified in the ulnar bursa.  The wounds were irrigated copiously, and the wounds injected with 1/2 % marcaine, and the skin closed with  nylon followed by a volar splint and sterile gauze. Tourniquet was deflated with good return of blood flow to all fingers. Sponge and needle counts were correct.  The patient tolerated this well, with no complications. The patient was awakened and taken to recovery in good condition.

## 2016-11-25 ENCOUNTER — Other Ambulatory Visit: Payer: Self-pay | Admitting: Family Medicine

## 2017-01-20 ENCOUNTER — Other Ambulatory Visit: Payer: Self-pay | Admitting: Family Medicine

## 2017-02-20 ENCOUNTER — Other Ambulatory Visit: Payer: Self-pay | Admitting: Family Medicine

## 2017-05-08 NOTE — Progress Notes (Signed)
Patient: Jeffery Norman, Male    DOB: July 14, 1955, 61 y.o.   MRN: [id number] Visit Date: 05/08/2017  Today's Provider: Mila Merry, MD   Chief Complaint  Patient presents with  . Annual Exam  . Hypertension    follow up  . Diabetes    follow up  . Hyperlipidemia    follow up   Subjective:    Annual physical exam Jeffery Norman is a 61 y.o. male who presents today for health maintenance and complete physical. He feels fairly well. He reports never exercising. He reports he is sleeping fairly well.  Wt Readings from Last 3 Encounters:  05/09/17 244 lb (110.7 kg)  10/02/16 242 lb (109.8 kg)  09/23/16 242 lb (109.8 kg)     -----------------------------------------------------------------  Diabetes Mellitus Type II, Follow-up:   Lab Results  Component Value Date   HGBA1C 5.8 (H) 09/23/2016   HGBA1C 5.6 05/20/2016   HGBA1C 5.7 (H) 05/15/2015   Last seen for diabetes 8 months ago.  Management since then includes; no changes. Advised to avoid sweets and starchy foods in diet. He reports good compliance with treatment. He is not having side effects.  Current symptoms include none and have been stable. Home blood sugar records: fasting range: 130's  Episodes of hypoglycemia? no   Current Insulin Regimen: none Most Recent Eye Exam: >1 year ago Weight trend: fluctuating a bit Prior visit with dietician: no Current diet: in general, an "unhealthy" diet Current exercise: none Scheduled for eye exam 06-05-2017 "[facility].  States he had been walking 14-15 miles a week, but has not had time to so for several months. Is also not adhering to diabetic diet very well the last few months.  ------------------------------------------------------------------------   Hypertension, follow-up:  BP Readings from Last 3 Encounters:  10/02/16 (!) 143/76  09/23/16 140/82  05/20/16 120/72    He was last seen for hypertension 8 months ago.  BP at that visit was  140/82. Management since that visit includes; no changes.He reports good compliance with treatment. He is not having side effects.  He is not exercising. He is not adherent to low salt diet.   Outside blood pressures are not being checked. He is experiencing none.  Patient denies chest pain, chest pressure/discomfort, claudication, dyspnea, exertional chest pressure/discomfort, fatigue, irregular heart beat, lower extremity edema, near-syncope, orthopnea, palpitations, paroxysmal nocturnal dyspnea, syncope and tachypnea.   Cardiovascular risk factors include diabetes mellitus, hypertension and male gender.  Use of agents associated with hypertension: NSAIDS.   ------------------------------------------------------------------------    Lipid/Cholesterol, Follow-up:   Last seen for this 12 months ago.  Management since that visit includes; labs checked, no cahnges.  Last Lipid Panel:    Component Value Date/Time   CHOL 154 05/20/2016 0953   TRIG 92 05/20/2016 0953   HDL 46 05/20/2016 0953   CHOLHDL 3.3 05/20/2016 0953   LDLCALC 90 05/20/2016 0953    He reports good compliance with treatment. He is not having side effects.   Wt Readings from Last 3 Encounters:  10/02/16 242 lb (109.8 kg)  09/23/16 242 lb (109.8 kg)  05/20/16 230 lb (104.3 kg)    ------------------------------------------------------------------------  ED (erectile dysfunction) of organic origin From 05/20/2016-Changed from Viagra to generic sildenafil 20mg  due to cost.  Elevated PSA From 09/23/2016-labs checked, no changes. Resume yearly PSA check.   Follow up GERD He continues on omeprazole daily which he feels is working well without adverse effects. Rarely has heartburn when he takes  medication.    Review of Systems  Constitutional: Negative for appetite change, chills, fatigue and fever.  HENT: Negative for congestion, ear pain, hearing loss, nosebleeds and trouble swallowing.   Eyes: Negative  for pain and visual disturbance.  Respiratory: Negative for cough, chest tightness and shortness of breath.   Cardiovascular: Negative for chest pain, palpitations and leg swelling.  Gastrointestinal: Negative for abdominal pain, blood in stool, constipation, diarrhea, nausea and vomiting.  Endocrine: Negative for polydipsia, polyphagia and polyuria.  Genitourinary: Negative for dysuria and flank pain.  Musculoskeletal: Positive for back pain. Negative for arthralgias, joint swelling, myalgias and neck stiffness.  Skin: Negative for color change, rash and wound.  Neurological: Negative for dizziness, tremors, seizures, speech difficulty, weakness, light-headedness and headaches.  Psychiatric/Behavioral: Negative for behavioral problems, confusion, decreased concentration, dysphoric mood and sleep disturbance. The patient is not nervous/anxious.   All other systems reviewed and are negative.   Social History       He  reports that he quit smoking about 5 years ago. His smoking use included cigarettes. He has a 12.50 pack-year smoking history. he has never used smokeless tobacco. He reports that he drinks alcohol. He reports that he does not use drugs.       Social History   Socioeconomic History  . Marital status: Married    Spouse name: Not on file  . Number of children: 1  . Years of education: Not on file  . Highest education level: Not on file  Social Needs  . Financial resource strain: Not on file  . Food insecurity - worry: Not on file  . Food insecurity - inability: Not on file  . Transportation needs - medical: Not on file  . Transportation needs - non-medical: Not on file  Occupational History  . Occupation: Chartered certified accountant  Tobacco Use  . Smoking status: Former Smoker    Packs/day: 0.50    Years: 25.00    Pack years: 12.50    Types: Cigarettes    Last attempt to quit: 08/02/2011    Years since quitting: 5.7  . Smokeless tobacco: Never Used  Substance and Sexual Activity  .  Alcohol use: Yes    Alcohol/week: 0.0 oz    Comment: moderate use  . Drug use: No  . Sexual activity: Not on file  Other Topics Concern  . Not on file  Social History Narrative  . Not on file    Past Medical History:  Diagnosis Date  . Carpal tunnel syndrome 11/10/2008  . Diabetes mellitus without complication (HCC)   . GERD (gastroesophageal reflux disease)   . Gout   . History of chicken pox   . History of measles   . History of mumps   . Hypertension      Patient Active Problem List   Diagnosis Date Noted  . Elevated PSA 05/21/2016  . Diabetes mellitus (HCC) 05/12/2015  . 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 Obesity 05/12/2015  . Skin tag 05/12/2015  . Weight loss 05/12/2015  . GERD (gastroesophageal reflux disease) 10/22/2007  . History of tobacco use 10/22/2007  . Gout 02/03/2007  . HLD (hyperlipidemia) 09/24/2005  . Essential (primary) hypertension 06/03/2001    Past Surgical History:  Procedure Laterality Date  . APPENDECTOMY  1960's  . CARPAL TUNNEL RELEASE Left 10/02/2016   Procedure: CARPAL TUNNEL RELEASE;  Surgeon: Deeann Saint, MD;  Location: [facility] ORS;  Service: Orthopedics;  Laterality: Left;  . index finger surgery  1981  Family History        Family Status  Relation Name Status  . Mother  Deceased at age 59       septic shock  . Father  Deceased at age 432       accidental  . Brother  Alive  . Son  Alive  . MGM  Deceased at age 59's  . Other  (Not Specified)        His family history includes Diabetes in his brother, mother, and other; Heart attack in his maternal grandmother; Hypertension in his mother.     No Known Allergies   Current Outpatient Medications:  .  aspirin 81 MG tablet, Take 1 tablet by mouth daily., Disp: , Rfl:  .  atorvastatin (LIPITOR) 40 MG tablet, TAKE 1 TABLET BY MOUTH EVERY DAY(NEEDS APPT), Disp: 30 tablet, Rfl: 5 .  bisoprolol (ZEBETA) 10 MG tablet, TAKE 1 TABLET BY MOUTH ONCE DAILY, Disp:  30 tablet, Rfl: 3 .  glucose blood (BAYER CONTOUR NEXT TEST) test strip, 1 strip daily. , Disp: , Rfl:  .  metFORMIN (GLUCOPHAGE-XR) 500 MG 24 hr tablet, TAKE 1 TABLET BY MOUTH TWICE DAILY, Disp: 60 tablet, Rfl: 5 .  Multiple Vitamin (MULTIVITAMIN) tablet, Take 1 tablet by mouth daily., Disp: , Rfl:  .  Omega-3 Fatty Acids (FISH OIL) 1000 MG CAPS, Take 2 capsules by mouth daily., Disp: , Rfl:  .  omeprazole (PRILOSEC) 40 MG capsule, TAKE 1 CAPSULE BY MOUTH DAILY, Disp: 30 capsule, Rfl: 11 .  sildenafil (REVATIO) 20 MG tablet, 3-5 tablets as needed prior to intercourse, not to exceed 5 tablets in a day, Disp: 50 tablet, Rfl: 3   Patient Care Team: Malva LimesFisher, Donald E, MD as PCP - General (Family Medicine)      Objective:   Vitals: BP 140/82 (BP Location: Left Arm, Patient Position: Sitting, Cuff Size: Large)   Pulse (!) 52   Temp 98 F (36.7 C) (Oral)   Resp 16   Ht 5\' 9"  (1.753 m)   Wt 244 lb (110.7 kg)   SpO2 99% Comment: room air  BMI 36.03 kg/m    Vitals:   05/09/17 0900 05/09/17 0930  BP: 140/82 136/80  Pulse: (!) 52   Resp: 16   Temp: 98 F (36.7 C)   TempSrc: Oral   SpO2: 99%   Weight: 244 lb (110.7 kg)   Height: 5\' 9"  (1.753 m)      Physical Exam   General Appearance:    Alert, cooperative, no distress, appears stated age, obese  Head:    Normocephalic, without obvious abnormality, atraumatic  Eyes:    PERRL, conjunctiva/corneas clear, EOM's intact, fundi    benign, both eyes       Ears:    Normal TM's and external ear canals, both ears  Nose:   Nares normal, septum midline, mucosa normal, no drainage   or sinus tenderness  Throat:   Lips, mucosa, and tongue normal; teeth and gums normal  Neck:   Supple, symmetrical, trachea midline, no adenopathy;       thyroid:  No enlargement/tenderness/nodules; no carotid   bruit or JVD  Back:     Symmetric, no curvature, ROM normal, no CVA tenderness  Lungs:     Clear to auscultation bilaterally, respirations unlabored   Chest wall:    No tenderness or deformity  Heart:    Regular rate and rhythm, S1 and S2 normal, no murmur, rub   or gallop  Abdomen:  Soft, non-tender, bowel sounds active all four quadrants,    no masses, no organomegaly  Genitalia:    deferred  Rectal:    deferred  Extremities:   Extremities normal, atraumatic, no cyanosis or edema  Pulses:   2+ and symmetric all extremities  Skin:   Skin color, texture, turgor normal, no rashes or lesions  Lymph nodes:   Cervical, supraclavicular, and axillary nodes normal  Neurologic:   CNII-XII intact. Normal strength, sensation and reflexes      throughout    Depression Screen PHQ 2/9 Scores 05/09/2017 09/23/2016 05/15/2015  PHQ - 2 Score 0 0 0  PHQ- 9 Score 0 0 1      Assessment & Plan:     Routine Health Maintenance and Physical Exam  Exercise Activities and Dietary recommendations Goals    None      Immunization History  Administered Date(s) Administered  . Td 03/28/1997  . Tdap 10/22/2007    Health Maintenance  Topic Date Due  . PNEUMOCOCCAL POLYSACCHARIDE VACCINE (1) 03/02/1960  . FOOT EXAM  03/02/1968  . OPHTHALMOLOGY EXAM  03/02/1968  . HIV Screening  03/02/1973  . INFLUENZA VACCINE  01/01/2017  . HEMOGLOBIN A1C  03/25/2017  . URINE MICROALBUMIN  05/20/2017  . TETANUS/TDAP  10/21/2017  . COLONOSCOPY  05/14/2022  . Hepatitis C Screening  Completed     Discussed health benefits of physical activity, and encouraged him to engage in regular exercise appropriate for his age and condition.    --------------------------------------------------------------------  1. Annual physical exam  - COMPLETE METABOLIC PANEL WITH GFR - CBC - Lipid panel - PSA - TSH - EKG 12-Lead  2. Essential (primary) hypertension Stable.  - EKG 12-Lead  3. Gastroesophageal reflux disease without esophagitis Doing well on omeprazole.   4. Type 2 diabetes mellitus with complication, unspecified whether long term insulin use  (HCC)  - EKG 12-Lead - POCT UA - Microalbumin - Pneumococcal polysaccharide vaccine 23-valent greater than or equal to 2yo subcutaneous/IM  5. ED (erectile dysfunction) of organic origin Prn sildenefil  6. Hyperlipidemia, unspecified hyperlipidemia type He is tolerating atorvastatin well with no adverse effects.   - COMPLETE METABOLIC PANEL WITH GFR - CBC - Lipid panel  7. Morbid obesity (HCC) Counseled regarding prudent diet and regular exercise.   - TSH  8. Need for pneumococcal vaccination  - Pneumococcal polysaccharide vaccine 23-valent greater than or equal to 2yo subcutaneous/IM  9. Prostate cancer screening  - PSA  .scribero  Mila Merryonald Fisher, MD  [facility]

## 2017-05-09 ENCOUNTER — Ambulatory Visit (INDEPENDENT_AMBULATORY_CARE_PROVIDER_SITE_OTHER): Payer: Managed Care, Other (non HMO) | Admitting: Family Medicine

## 2017-05-09 ENCOUNTER — Encounter: Payer: Self-pay | Admitting: Family Medicine

## 2017-05-09 VITALS — BP 136/80 | HR 52 | Temp 98.0°F | Resp 16 | Ht 69.0 in | Wt 244.0 lb

## 2017-05-09 DIAGNOSIS — E118 Type 2 diabetes mellitus with unspecified complications: Secondary | ICD-10-CM

## 2017-05-09 DIAGNOSIS — Z0000 Encounter for general adult medical examination without abnormal findings: Secondary | ICD-10-CM | POA: Diagnosis not present

## 2017-05-09 DIAGNOSIS — N529 Male erectile dysfunction, unspecified: Secondary | ICD-10-CM

## 2017-05-09 DIAGNOSIS — Z23 Encounter for immunization: Secondary | ICD-10-CM | POA: Diagnosis not present

## 2017-05-09 DIAGNOSIS — I10 Essential (primary) hypertension: Secondary | ICD-10-CM

## 2017-05-09 DIAGNOSIS — Z125 Encounter for screening for malignant neoplasm of prostate: Secondary | ICD-10-CM

## 2017-05-09 DIAGNOSIS — E785 Hyperlipidemia, unspecified: Secondary | ICD-10-CM

## 2017-05-09 DIAGNOSIS — K219 Gastro-esophageal reflux disease without esophagitis: Secondary | ICD-10-CM | POA: Diagnosis not present

## 2017-05-09 LAB — POCT UA - MICROALBUMIN: Microalbumin Ur, POC: 20 mg/L

## 2017-05-09 NOTE — Patient Instructions (Addendum)
   It is recommended to engage in 150 minutes of moderate exercise every week.    Reduce sugars and starches in diet, and reduce meal portions. Try to get weight under 210 within the next 6 months.

## 2017-05-10 LAB — LIPID PANEL
CHOL/HDL RATIO: 3.5 (calc) (ref <5.0)
CHOLESTEROL: 170 mg/dL (ref <200)
HDL: 48 mg/dL (ref >40)
LDL Cholesterol (Calc): 99 mg/dL (calc)
NON-HDL CHOLESTEROL (CALC): 122 mg/dL (ref <130)
TRIGLYCERIDES: 124 mg/dL (ref <150)

## 2017-05-10 LAB — CBC
HEMATOCRIT: 40.8 % (ref 38.5–50.0)
HEMOGLOBIN: 14.1 g/dL (ref 13.2–17.1)
MCH: 30.5 pg (ref 27.0–33.0)
MCHC: 34.6 g/dL (ref 32.0–36.0)
MCV: 88.1 fL (ref 80.0–100.0)
MPV: 10.7 fL (ref 7.5–12.5)
Platelets: 194 10*3/uL (ref 140–400)
RBC: 4.63 10*6/uL (ref 4.20–5.80)
RDW: 13.6 % (ref 11.0–15.0)
WBC: 7.1 10*3/uL (ref 3.8–10.8)

## 2017-05-10 LAB — COMPLETE METABOLIC PANEL WITH GFR
AG Ratio: 1.5 (calc) (ref 1.0–2.5)
ALBUMIN MSPROF: 4.3 g/dL (ref 3.6–5.1)
ALKALINE PHOSPHATASE (APISO): 62 U/L (ref 40–115)
ALT: 24 U/L (ref 9–46)
AST: 23 U/L (ref 10–35)
BILIRUBIN TOTAL: 0.9 mg/dL (ref 0.2–1.2)
BUN: 20 mg/dL (ref 7–25)
CHLORIDE: 104 mmol/L (ref 98–110)
CO2: 27 mmol/L (ref 20–32)
CREATININE: 0.96 mg/dL (ref 0.70–1.33)
Calcium: 9.4 mg/dL (ref 8.6–10.3)
GFR, Est African American: 100 mL/min/{1.73_m2} (ref >=60)
GFR, Est Non African American: 86 mL/min/{1.73_m2} (ref >=60)
GLUCOSE: 109 mg/dL — AB (ref 65–99)
Globulin: 2.8 g/dL (calc) (ref 1.9–3.7)
Potassium: 4.4 mmol/L (ref 3.5–5.3)
Sodium: 139 mmol/L (ref 135–146)
Total Protein: 7.1 g/dL (ref 6.1–8.1)

## 2017-05-10 LAB — PSA: PSA: 0.7 ng/mL (ref <=4.0)

## 2017-05-10 LAB — TSH: TSH: 1.84 mIU/L (ref 0.40–4.50)

## 2017-06-23 ENCOUNTER — Other Ambulatory Visit: Payer: Self-pay | Admitting: Family Medicine

## 2017-06-23 MED ORDER — BISOPROLOL FUMARATE 10 MG PO TABS: 10.0000 mg | ORAL_TABLET | Freq: Every day | ORAL | 11 refills | Status: DC

## 2017-06-23 NOTE — Telephone Encounter (Signed)
[facility]

## 2017-07-01 ENCOUNTER — Encounter: Payer: Self-pay | Admitting: Emergency Medicine

## 2017-07-01 ENCOUNTER — Other Ambulatory Visit: Payer: Self-pay

## 2017-07-01 ENCOUNTER — Emergency Department: Payer: BLUE CROSS/BLUE SHIELD

## 2017-07-01 ENCOUNTER — Emergency Department
Admission: EM | Admit: 2017-07-01 | Discharge: 2017-07-01 | Disposition: A | Discharge status: Home / Self Care | Payer: BLUE CROSS/BLUE SHIELD | Attending: Emergency Medicine | Admitting: Emergency Medicine

## 2017-07-01 DIAGNOSIS — Z7982 Long term (current) use of aspirin: Secondary | ICD-10-CM | POA: Insufficient documentation

## 2017-07-01 DIAGNOSIS — E119 Type 2 diabetes mellitus without complications: Secondary | ICD-10-CM | POA: Insufficient documentation

## 2017-07-01 DIAGNOSIS — Z7984 Long term (current) use of oral hypoglycemic drugs: Secondary | ICD-10-CM | POA: Insufficient documentation

## 2017-07-01 DIAGNOSIS — T782XXA Anaphylactic shock, unspecified, initial encounter: Secondary | ICD-10-CM | POA: Diagnosis not present

## 2017-07-01 DIAGNOSIS — Z79899 Other long term (current) drug therapy: Secondary | ICD-10-CM | POA: Insufficient documentation

## 2017-07-01 DIAGNOSIS — I10 Essential (primary) hypertension: Secondary | ICD-10-CM | POA: Insufficient documentation

## 2017-07-01 DIAGNOSIS — R062 Wheezing: Secondary | ICD-10-CM | POA: Diagnosis not present

## 2017-07-01 DIAGNOSIS — R200 Anesthesia of skin: Secondary | ICD-10-CM | POA: Diagnosis not present

## 2017-07-01 DIAGNOSIS — T7840XA Allergy, unspecified, initial encounter: Secondary | ICD-10-CM | POA: Diagnosis not present

## 2017-07-01 DIAGNOSIS — R0602 Shortness of breath: Secondary | ICD-10-CM | POA: Diagnosis not present

## 2017-07-01 DIAGNOSIS — F17210 Nicotine dependence, cigarettes, uncomplicated: Secondary | ICD-10-CM | POA: Diagnosis not present

## 2017-07-01 DIAGNOSIS — R110 Nausea: Secondary | ICD-10-CM | POA: Diagnosis not present

## 2017-07-01 DIAGNOSIS — T7849XA Other allergy, initial encounter: Secondary | ICD-10-CM | POA: Diagnosis not present

## 2017-07-01 LAB — CBC WITH DIFFERENTIAL/PLATELET
BASOS ABS: 0 10*3/uL (ref 0–0.1)
BASOS PCT: 0 %
Eosinophils Absolute: 0.2 10*3/uL (ref 0–0.7)
Eosinophils Relative: 2 %
HEMATOCRIT: 45.4 % (ref 40.0–52.0)
HEMOGLOBIN: 15.4 g/dL (ref 13.0–18.0)
LYMPHS PCT: 34 %
Lymphs Abs: 2.8 10*3/uL (ref 1.0–3.6)
MCH: 30.3 pg (ref 26.0–34.0)
MCHC: 34 g/dL (ref 32.0–36.0)
MCV: 89 fL (ref 80.0–100.0)
MONO ABS: 0.4 10*3/uL (ref 0.2–1.0)
Monocytes Relative: 5 %
NEUTROS ABS: 5 10*3/uL (ref 1.4–6.5)
NEUTROS PCT: 59 %
Platelets: 260 10*3/uL (ref 150–440)
RBC: 5.1 MIL/uL (ref 4.40–5.90)
RDW: 14.6 % — AB (ref 11.5–14.5)
WBC: 8.4 10*3/uL (ref 3.8–10.6)

## 2017-07-01 LAB — COMPREHENSIVE METABOLIC PANEL
ALBUMIN: 4 g/dL (ref 3.5–5.0)
ALK PHOS: 69 U/L (ref 38–126)
ALT: 25 U/L (ref 17–63)
AST: 36 U/L (ref 15–41)
Anion gap: 12 (ref 5–15)
BILIRUBIN TOTAL: 1.4 mg/dL — AB (ref 0.3–1.2)
BUN: 23 mg/dL — AB (ref 6–20)
CO2: 19 mmol/L — ABNORMAL LOW (ref 22–32)
Calcium: 8.5 mg/dL — ABNORMAL LOW (ref 8.9–10.3)
Chloride: 105 mmol/L (ref 101–111)
Creatinine, Ser: 1.45 mg/dL — ABNORMAL HIGH (ref 0.61–1.24)
GFR calc Af Amer: 59 mL/min — ABNORMAL LOW (ref >60)
GFR, EST NON AFRICAN AMERICAN: 51 mL/min — AB (ref >60)
GLUCOSE: 198 mg/dL — AB (ref 65–99)
Potassium: 4 mmol/L (ref 3.5–5.1)
Sodium: 136 mmol/L (ref 135–145)
TOTAL PROTEIN: 6.8 g/dL (ref 6.5–8.1)

## 2017-07-01 LAB — LIPASE, BLOOD: LIPASE: 41 U/L (ref 11–51)

## 2017-07-01 LAB — TROPONIN I: Troponin I: <0.03 ng/mL (ref <0.03)

## 2017-07-01 MED ORDER — ALBUTEROL SULFATE (2.5 MG/3ML) 0.083% IN NEBU
5.0000 mg | INHALATION_SOLUTION | Freq: Once | RESPIRATORY_TRACT | Status: AC
  Administered 2017-07-01: 5 mg via RESPIRATORY_TRACT
  Filled 2017-07-01: qty 6

## 2017-07-01 MED ORDER — EPINEPHRINE 0.3 MG/0.3ML IJ SOAJ
0.3000 mg | Freq: Once | INTRAMUSCULAR | Status: AC
  Administered 2017-07-01: 0.3 mg via INTRAMUSCULAR
  Filled 2017-07-01: qty 0.3

## 2017-07-01 MED ORDER — FAMOTIDINE IN NACL 20-0.9 MG/50ML-% IV SOLN
20.0000 mg | Freq: Once | INTRAVENOUS | Status: AC
  Administered 2017-07-01: 20 mg via INTRAVENOUS
  Filled 2017-07-01: qty 50

## 2017-07-01 MED ORDER — METHYLPREDNISOLONE SODIUM SUCC 125 MG IJ SOLR
125.0000 mg | Freq: Once | INTRAMUSCULAR | Status: AC
  Administered 2017-07-01: 125 mg via INTRAVENOUS
  Filled 2017-07-01: qty 2

## 2017-07-01 MED ORDER — PREDNISONE 10 MG PO TABS: ORAL_TABLET | ORAL | 0 refills | Status: DC

## 2017-07-01 MED ORDER — EPINEPHRINE 0.3 MG/0.3ML IJ SOAJ: 0.3000 mg | Freq: Once | INTRAMUSCULAR | 0 refills | Status: AC

## 2017-07-01 NOTE — ED Triage Notes (Signed)
Pt to ED via EMS from work c/o allergic reaction. Diaphoretic, SOB, facial numbness.  Lungs exp. Wheezing.  80/49 BP intially. 101 epi, 50mg  IV bendadryl 1103, 750 NS bolus, 1 duoneb.  Denies allergies.  Took alieve 1hr prior to symptoms.

## 2017-07-01 NOTE — Discharge Instructions (Signed)

## 2017-07-01 NOTE — ED Provider Notes (Signed)
[facility] Emergency Department Provider Note  ____________________________________________   First MD Initiated Contact with Patient 07/01/17 1133     (approximate)  I have reviewed the triage vital signs and the nursing notes.   HISTORY  Chief Complaint Allergic Reaction  Level 5 caveat:  history/ROS limited by acute/critical illness  HPI Jeffery Norman is a 62 y.o. male with noncontributory medical history and no prior history of anaphylaxis or severe allergic reaction.  He presents by EMS with shortness of breath, subjective throat swelling, hypoxemia, hypotension, and being bright red all over.  Reportedly he was at work and had a small headache and took a naproxen, which he states he has taken before.  Shortly thereafter all of his symptoms began, and EMS was called because he became very diaphoretic and felt like he was having trouble breathing.  EMS found that he had a blood pressure of 80/40 and had an oxygen saturation in the low 80's with some audible wheezing.  They started fluids, gave epinephrine 0.3 mg intramuscular, and gave Benadryl 50 mg IV and a DuoNeb.  He was feeling better almost immediately but then started to feel additional throat swelling by the time came to the hospital.  He does not have any itching but his entire body surface area is bright red which she says was not present when he woke up this morning.  He is still mildly diaphoretic.  He has had some nausea but no vomiting and no diarrhea.  He denies any recent illness and said that he was more or less in a usual state of health until this began.  He has had no new environmental exposures of which he is aware.  Past Medical History:  Diagnosis Date  . Carpal tunnel syndrome 11/10/2008  . Diabetes mellitus without complication (HCC)   . GERD (gastroesophageal reflux disease)   . Gout   . History of chicken pox   . History of measles   . History of mumps   . Hypertension      Patient Active Problem List   Diagnosis Date Noted  . Elevated PSA 05/21/2016  . Diabetes mellitus (HCC) 05/12/2015  . 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 Morbid obesity (HCC) 05/12/2015  . Skin tag 05/12/2015  . Weight loss 05/12/2015  . GERD (gastroesophageal reflux disease) 10/22/2007  . History of tobacco use 10/22/2007  . Gout 02/03/2007  . HLD (hyperlipidemia) 09/24/2005  . Essential (primary) hypertension 06/03/2001    Past Surgical History:  Procedure Laterality Date  . APPENDECTOMY  1960's  . CARPAL TUNNEL RELEASE Left 10/02/2016   Procedure: CARPAL TUNNEL RELEASE;  Surgeon: Deeann Saint, MD;  Location: [facility] ORS;  Service: Orthopedics;  Laterality: Left;  . index finger surgery  1981    Prior to Admission medications   Medication Sig Start Date End Date Taking? Authorizing Provider  aspirin 81 MG tablet Take 1 tablet by mouth daily.    [provider]  atorvastatin (LIPITOR) 40 MG tablet TAKE 1 TABLET BY MOUTH EVERY DAY(NEEDS APPT) 01/20/17   Malva Limes, MD  bisoprolol (ZEBETA) 10 MG tablet Take 1 tablet (10 mg total) by mouth daily. 06/23/17   Malva Limes, MD  EPINEPHrine (EPIPEN 2-PAK) 0.3 mg/0.3 mL IJ SOAJ injection Inject 0.3 mLs (0.3 mg total) into the muscle once for 1 dose. Take for severe allergic reaction, then come immediately to the Emergency Department or call 911. 07/01/17 07/01/17  Loleta Rose, MD  glucose  blood (BAYER CONTOUR NEXT TEST) test strip 1 strip daily.  06/05/13   [provider]  metFORMIN (GLUCOPHAGE-XR) 500 MG 24 hr tablet TAKE 1 TABLET BY MOUTH TWICE DAILY 01/20/17   Malva Limes, MD  Multiple Vitamin (MULTIVITAMIN) tablet Take 1 tablet by mouth daily.    [provider]  Omega-3 Fatty Acids (FISH OIL) 1000 MG CAPS Take 2 capsules by mouth daily. 01/14/06   [provider]  omeprazole (PRILOSEC) 40 MG capsule TAKE 1 CAPSULE BY MOUTH DAILY 11/25/16   Malva Limes, MD   predniSONE (DELTASONE) 10 MG tablet Take 6 tabs (60 mg) PO x 3 days, then take 4 tabs (40 mg) PO x 3 days, then take 2 tabs (20 mg) PO x 3 days, then take 1 tab (10 mg) PO x 3 days, then take 1/2 tab (5 mg) PO x 4 days. 07/01/17   Loleta Rose, MD  sildenafil (REVATIO) 20 MG tablet 3-5 tablets as needed prior to intercourse, not to exceed 5 tablets in a day 05/20/16   Malva Limes, MD    Allergies Patient has no known allergies.  Family History  Problem Relation Age of Onset  . Diabetes Mother   . Hypertension Mother   . Diabetes Brother   . Heart attack Maternal Grandmother   . Diabetes Other     Social History Social History   Tobacco Use  . Smoking status: Current Every Day Smoker    Packs/day: 0.50    Years: 25.00    Pack years: 12.50    Types: E-cigarettes, Cigarettes    Last attempt to quit: 08/02/2011    Years since quitting: 5.9  . Smokeless tobacco: Never Used  Substance Use Topics  . Alcohol use: Yes    Alcohol/week: 0.0 oz    Comment: moderate use  . Drug use: No    Review of Systems Level 5 caveat:  history/ROS limited by acute/critical illness, see HPI for additional details ____________________________________________   PHYSICAL EXAM:  VITAL SIGNS: ED Triage Vitals  Enc Vitals Group     BP 07/01/17 1142 (!) 122/51     Pulse Rate 07/01/17 1142 87     Resp 07/01/17 1142 17     Temp --      Temp src --      SpO2 07/01/17 1142 99 %     Weight 07/01/17 1143 106.6 kg (235 lb)     Height 07/01/17 1143 1.753 m (5\' 9" )     Head Circumference --      Peak Flow --      Pain Score --      Pain Loc --      Pain Edu? --      Excl. in GC? --     Constitutional: Alert and oriented but does appear to be in moderate distress Eyes: Conjunctivae are normal.  Head: Atraumatic. Nose: No congestion/rhinnorhea. Mouth/Throat: Mucous membranes are moist.  Oropharynx non-erythematous.  No obvious edema at this time but the patient does report some tingling in  his tongue and some subjective swelling Neck: No stridor.  No meningeal signs.   Cardiovascular: Normal rate, regular rhythm. Good peripheral circulation. Grossly normal heart sounds. Respiratory: Normal respiratory effort but increased rate.  No retractions. Lungs CTAB.  currently on 100% NRB Gastrointestinal: Soft and nontender. No distention.  Musculoskeletal: No lower extremity tenderness nor edema. No gross deformities of extremities. Neurologic:  Normal speech and language. No gross focal neurologic deficits are appreciated.  Skin:  Skin is brightly erythematous throughout without any obvious urticaria Psychiatric: Mood and affect are normal. Speech and behavior are normal.  ____________________________________________   LABS (all labs ordered are listed, but only abnormal results are displayed)  Labs Reviewed  CBC WITH DIFFERENTIAL/PLATELET - Abnormal; Notable for the following components:      Result Value   RDW 14.6 (*)    All other components within normal limits  COMPREHENSIVE METABOLIC PANEL - Abnormal; Notable for the following components:   CO2 19 (*)    Glucose, Bld 198 (*)    BUN 23 (*)    Creatinine, Ser 1.45 (*)    Calcium 8.5 (*)    Total Bilirubin 1.4 (*)    GFR calc non Af Amer 51 (*)    GFR calc Af Amer 59 (*)    All other components within normal limits  LIPASE, BLOOD  TROPONIN I   ____________________________________________  EKG  ED ECG REPORT I, Loleta Roseory Brittnei Jagiello, the attending physician, personally viewed and interpreted this ECG.  Date: 07/01/2017 EKG Time: 11:41 AM Rate: 90 Rhythm: normal sinus rhythm QRS Axis: LAD Intervals: Anterior fascicular block, prolonged QTC at 513 ms  ST/T Wave abnormalities: Non-specific ST segment / T-wave changes including flipped T waves in lead III, but no evidence of acute ischemia. Narrative Interpretation: no evidence of acute ischemia   ____________________________________________  RADIOLOGY   ED MD  interpretation: No evidence of pneumonia or pulmonary  Official radiology report(s): Dg Chest Portable 1 View  Result Date: 07/01/2017 CLINICAL DATA:  Shortness of Breath EXAM: PORTABLE CHEST 1 VIEW COMPARISON:  None. FINDINGS: The heart size and mediastinal contours are within normal limits. Both lungs are clear. The visualized skeletal structures are unremarkable. IMPRESSION: No active disease. Electronically Signed   By: Alcide CleverMark  Lukens M.D.   On: 07/01/2017 12:17    ____________________________________________   PROCEDURES  Critical Care performed: Yes, see critical care procedure note(s)   Procedure(s) performed:   .Critical Care Performed by: Loleta RoseForbach, Myquan Schaumburg, MD Authorized by: Loleta RoseForbach, Elijio Staples, MD   Critical care provider statement:    Critical care time (minutes):  60   Critical care time was exclusive of:  Separately billable procedures and treating other patients   Critical care was necessary to treat or prevent imminent or life-threatening deterioration of the following conditions: anaphylaxis.   Critical care was time spent personally by me on the following activities:  Development of treatment plan with patient or surrogate, discussions with consultants, evaluation of patient's response to treatment, examination of patient, obtaining history from patient or surrogate, ordering and performing treatments and interventions, ordering and review of laboratory studies, ordering and review of radiographic studies, pulse oximetry, re-evaluation of patient's condition and review of old charts     ____________________________________________   INITIAL IMPRESSION / ASSESSMENT AND PLAN / ED COURSE  As part of my medical decision making, I reviewed the following data within the electronic MEDICAL RECORD NUMBER Nursing notes reviewed and incorporated, Labs reviewed , EKG interpreted  and Radiograph reviewed     Differential diagnosis includes, but is not limited to, anaphylaxis to unknown  allergen, medication side effect, much less likely ACS or acute infectious process.  Everything about the initial presentation looks like anaphylaxis although it is unclear what was the responsible allergen/agent.  He started to once again decompensate fairly quickly after some initial improvement from the EMS epinephrine injection, so I ordered another EpiPen as well as Solu-Medrol 125 mg and famotidine 20 mg.  We  are also giving him another albuterol 5 mg through the nonrebreather.  We will watch him very closely because I am concerned we may need to start an epinephrine drip on him.  He is shaky but stable at this point and his blood pressure has improved to normal.  He is getting an IV bolus.  Received Benadryl 50 mg IV prior to arrival.  Clinical Course as of Jul 01 1808  Tue Jul 01, 2017  1205 It is severely tremulous at this point but states that he feels better from a breathing and throat swelling/tingling perspective.  He continues to not have any wheezing.  He is alert and oriented and his blood pressure is stable.  I will not start any additional epinephrine at this time.  We will continue to monitor closely.  [CF]  1301 Patient remained stable, still tremulous but not hypotensive and not having any difficulty breathing.  [CF]  1303 Labs are generally reassuring slightly decreased CO2 which may be the result of his hyperventilation which has resolved and a slightly elevated BUN and creatinine.  I am continue to provide IV fluids and the patient has stabilized at this time.  [CF]  1303 No acute abnormalities visualized on chest x-ray DG Chest Portable 1 View [CF]  1421 Patient stable, no worsening symptoms  [CF]  1535 The patient is looking and feeling much better.  No more erythema, no difficulty breathing, still no abdominal pain nor diarrhea.  Patient is anxious to go home but I reminded him of our discussion were explained that we need to watch him for about 6 hours given the severity of his  anaphylaxis.  He is comfortable waiting until the 6-hour mark.  I gave him my usual discussion of the need to carry out epi pens with him and take prednisone for the next few days at least although I may prescribe him a prednisone taper given the severity of his reaction and the unknown cause of the anaphylaxis.  [CF]  1730 Has been here for 6 hours and is asymptomatic.  He and his wife very much want to go home.  I had my usual customary discussion and return precautions and I encouraged them to go directly to the pharmacy to get his EpiPen's  [CF]    Clinical Course User Index [CF] Loleta Rose, MD    ____________________________________________  FINAL CLINICAL IMPRESSION(S) / ED DIAGNOSES  Final diagnoses:  Anaphylaxis, initial encounter     MEDICATIONS GIVEN DURING THIS VISIT:  Medications  EPINEPHrine (EPI-PEN) injection 0.3 mg (0.3 mg Intramuscular Given 07/01/17 1138)  methylPREDNISolone sodium succinate (SOLU-MEDROL) 125 mg/2 mL injection 125 mg (125 mg Intravenous Given 07/01/17 1141)  famotidine (PEPCID) IVPB 20 mg premix (0 mg Intravenous Stopped 07/01/17 1211)  albuterol (PROVENTIL) (2.5 MG/3ML) 0.083% nebulizer solution 5 mg (5 mg Nebulization Given 07/01/17 1141)     ED Discharge Orders        Ordered    predniSONE (DELTASONE) 10 MG tablet     07/01/17 1600    EPINEPHrine (EPIPEN 2-PAK) 0.3 mg/0.3 mL IJ SOAJ injection   Once     07/01/17 1600       Note:  This document was prepared using Dragon voice recognition software and may include unintentional dictation errors.    Loleta Rose, MD 07/01/17 1810

## 2017-07-07 ENCOUNTER — Ambulatory Visit (INDEPENDENT_AMBULATORY_CARE_PROVIDER_SITE_OTHER): Payer: BLUE CROSS/BLUE SHIELD | Admitting: Family Medicine

## 2017-07-07 ENCOUNTER — Encounter: Payer: Self-pay | Admitting: Family Medicine

## 2017-07-07 VITALS — BP 120/74 | HR 63 | Temp 98.0°F | Resp 16 | Wt 246.0 lb

## 2017-07-07 DIAGNOSIS — T782XXA Anaphylactic shock, unspecified, initial encounter: Secondary | ICD-10-CM | POA: Diagnosis not present

## 2017-07-07 NOTE — Progress Notes (Signed)
Patient: Jeffery Norman Male    DOB: [birth year]   62 y.o.   MRN: [id number] Visit Date: 07/07/2017  Today's Provider: Mila Merry, MD   Chief Complaint  Patient presents with  . Hospitalization Follow-up   Subjective:    HPI   Follow up ER visit  Patient was seen in ER for Anaphylaxis on 07/01/2017. He was treated for Anaphylaxis. Treatment for this included; patient was given epinephrine injection in ER. Patient was also given sol v medrol 125 mg and famotidine 20 mg, Albuterol 5 mg. Patient received IV Benadryl 50 mg prior to arrival. Patient monitored for 6 hours then discharged home after symptoms improved.  He reports good compliance with treatment. He reports this condition is improved.  ----------------------------------------------------------------  Symptoms have He is still taking prednisone taper.  Episode started as swelling in lips followed by hands tingling.  Had taken naproxen about 30 minutes before reaction, which he takes from time to time and has never had a reaction before. No other new exposures. Had a microwaver dinner to eat a few hours before episode.    No Known Allergies   Current Outpatient Medications:  .  aspirin 81 MG tablet, Take 1 tablet by mouth daily., Disp: , Rfl:  .  atorvastatin (LIPITOR) 40 MG tablet, TAKE 1 TABLET BY MOUTH EVERY DAY(NEEDS APPT), Disp: 30 tablet, Rfl: 5 .  bisoprolol (ZEBETA) 10 MG tablet, Take 1 tablet (10 mg total) by mouth daily., Disp: 30 tablet, Rfl: 11 .  glucose blood (BAYER CONTOUR NEXT TEST) test strip, 1 strip daily. , Disp: , Rfl:  .  metFORMIN (GLUCOPHAGE-XR) 500 MG 24 hr tablet, TAKE 1 TABLET BY MOUTH TWICE DAILY, Disp: 60 tablet, Rfl: 5 .  Multiple Vitamin (MULTIVITAMIN) tablet, Take 1 tablet by mouth daily., Disp: , Rfl:  .  Omega-3 Fatty Acids (FISH OIL) 1000 MG CAPS, Take 2 capsules by mouth daily., Disp: , Rfl:  .  omeprazole (PRILOSEC) 40 MG capsule, TAKE 1 CAPSULE BY MOUTH DAILY, Disp: 30  capsule, Rfl: 11 .  predniSONE (DELTASONE) 10 MG tablet, Take 6 tabs (60 mg) PO x 3 days, then take 4 tabs (40 mg) PO x 3 days, then take 2 tabs (20 mg) PO x 3 days, then take 1 tab (10 mg) PO x 3 days, then take 1/2 tab (5 mg) PO x 4 days., Disp: 41 tablet, Rfl: 0 .  sildenafil (REVATIO) 20 MG tablet, 3-5 tablets as needed prior to intercourse, not to exceed 5 tablets in a day, Disp: 50 tablet, Rfl: 3  Review of Systems  Constitutional: Negative for appetite change, chills and fever.  Respiratory: Negative for chest tightness, shortness of breath and wheezing.   Cardiovascular: Negative for chest pain and palpitations.  Gastrointestinal: Negative for abdominal pain, nausea and vomiting.    Social History   Tobacco Use  . Smoking status: Current Every Day Smoker    Packs/day: 0.50    Years: 25.00    Pack years: 12.50    Types: E-cigarettes, Cigarettes    Last attempt to quit: 08/02/2011    Years since quitting: 5.9  . Smokeless tobacco: Never Used  Substance Use Topics  . Alcohol use: Yes    Alcohol/week: 0.0 oz    Comment: moderate use   Objective:   BP 120/74 (BP Location: Right Arm, Patient Position: Sitting, Cuff Size: Large)   Pulse 63   Temp 98 F (36.7 C) (Oral)   Resp 16  Wt 246 lb (111.6 kg)   SpO2 97%   BMI 36.33 kg/m     Physical Exam   General Appearance:    Alert, cooperative, no distress  Eyes:    PERRL, conjunctiva/corneas clear, EOM's intact       Lungs:     Clear to auscultation bilaterally, respirations unlabored  Heart:    Regular rate and rhythm  Neurologic:   Awake, alert, oriented x 3. No apparent focal neurological           defect.           Assessment & Plan:     1. Anaphylaxis, initial encounter Resolved in ER and no recurrent sx since discharge. Occurred 30 minutes after taking Naproxen, although he has take many times in the past without prescription. Recommend he avoid all NSAIDs including ASA. Has epipen that was prescribed from ER  and advised to keep with him at all times.        Mila Merryonald Holle Sprick, MD  [facility]

## 2017-07-23 ENCOUNTER — Other Ambulatory Visit: Payer: Self-pay | Admitting: Family Medicine

## 2017-07-24 ENCOUNTER — Other Ambulatory Visit: Payer: Self-pay | Admitting: Family Medicine

## 2017-07-24 NOTE — Telephone Encounter (Signed)
Pharmacy requesting refills. Thanks!  

## 2017-09-10 ENCOUNTER — Other Ambulatory Visit: Payer: Self-pay | Admitting: Family Medicine

## 2017-09-10 DIAGNOSIS — N529 Male erectile dysfunction, unspecified: Secondary | ICD-10-CM

## 2017-12-19 ENCOUNTER — Other Ambulatory Visit: Payer: Self-pay | Admitting: Family Medicine

## 2018-01-21 ENCOUNTER — Other Ambulatory Visit: Payer: Self-pay | Admitting: Family Medicine

## 2018-03-06 LAB — HM DIABETES EYE EXAM

## 2018-06-20 ENCOUNTER — Other Ambulatory Visit: Payer: Self-pay | Admitting: Family Medicine

## 2018-07-22 ENCOUNTER — Other Ambulatory Visit: Payer: Self-pay | Admitting: Family Medicine

## 2018-08-20 ENCOUNTER — Other Ambulatory Visit: Payer: Self-pay | Admitting: Family Medicine

## 2018-10-08 ENCOUNTER — Encounter: Payer: Self-pay | Admitting: Family Medicine

## 2018-10-16 ENCOUNTER — Encounter: Payer: Self-pay | Admitting: Family Medicine

## 2018-10-16 ENCOUNTER — Other Ambulatory Visit: Payer: Self-pay

## 2018-10-16 ENCOUNTER — Ambulatory Visit (INDEPENDENT_AMBULATORY_CARE_PROVIDER_SITE_OTHER): Payer: BC Managed Care – PPO | Admitting: Family Medicine

## 2018-10-16 VITALS — BP 181/85 | HR 63 | Temp 99.2°F | Wt 239.8 lb

## 2018-10-16 DIAGNOSIS — E785 Hyperlipidemia, unspecified: Secondary | ICD-10-CM | POA: Diagnosis not present

## 2018-10-16 DIAGNOSIS — I10 Essential (primary) hypertension: Secondary | ICD-10-CM | POA: Diagnosis not present

## 2018-10-16 DIAGNOSIS — R809 Proteinuria, unspecified: Secondary | ICD-10-CM

## 2018-10-16 DIAGNOSIS — Z23 Encounter for immunization: Secondary | ICD-10-CM

## 2018-10-16 DIAGNOSIS — E119 Type 2 diabetes mellitus without complications: Secondary | ICD-10-CM | POA: Diagnosis not present

## 2018-10-16 DIAGNOSIS — E1129 Type 2 diabetes mellitus with other diabetic kidney complication: Secondary | ICD-10-CM | POA: Diagnosis not present

## 2018-10-16 DIAGNOSIS — Z125 Encounter for screening for malignant neoplasm of prostate: Secondary | ICD-10-CM | POA: Diagnosis not present

## 2018-10-16 DIAGNOSIS — Z0000 Encounter for general adult medical examination without abnormal findings: Secondary | ICD-10-CM

## 2018-10-16 LAB — POCT UA - MICROALBUMIN: Microalbumin Ur, POC: 50 mg/L

## 2018-10-16 NOTE — Progress Notes (Deleted)
Patient: Jeffery Norman, Male    DOB: 1955/08/09, 63 y.o.   MRN: [id number] Visit Date: 10/16/2018  Today's Provider: Mila Merry, MD   No chief complaint on file.  Subjective:     Annual physical exam DEAKYN STOLZENBURG is a 63 y.o. male who presents today for health maintenance and complete physical. He feels {DESC; WELL/FAIRLY WELL/POORLY:18703}. He reports exercising ***. He reports he is sleeping {DESC; WELL/FAIRLY WELL/POORLY:18703}.  Colonoscopy- 05/14/2012, Diverticulosis. Repeat 7yrs. Tdap- 10/22/2007   Review of Systems  Constitutional: Negative.   HENT: Negative.   Eyes: Negative.   Respiratory: Negative.   Cardiovascular: Negative.   Gastrointestinal: Negative.   Endocrine: Negative.   Genitourinary: Negative.   Musculoskeletal: Negative.   Skin: Negative.   Allergic/Immunologic: Negative.   Neurological: Negative.   Hematological: Negative.   Psychiatric/Behavioral: Negative.     Social History      He  reports that he has been smoking e-cigarettes and cigarettes. He has a 12.50 pack-year smoking history. He has never used smokeless tobacco. He reports current alcohol use. He reports that he does not use drugs.       Social History   Socioeconomic History  . Marital status: Married    Spouse name: Not on file  . Number of children: 1  . Years of education: Not on file  . Highest education level: Not on file  Occupational History  . Occupation: Chartered certified accountant  Social Needs  . Financial resource strain: Not on file  . Food insecurity:    Worry: Not on file    Inability: Not on file  . Transportation needs:    Medical: Not on file    Non-medical: Not on file  Tobacco Use  . Smoking status: Current Every Day Smoker    Packs/day: 0.50    Years: 25.00    Pack years: 12.50    Types: E-cigarettes, Cigarettes    Last attempt to quit: 08/02/2011    Years since quitting: 7.2  . Smokeless tobacco: Never Used  Substance and Sexual Activity  . Alcohol  use: Yes    Alcohol/week: 0.0 standard drinks    Comment: moderate use  . Drug use: No  . Sexual activity: Not on file  Lifestyle  . Physical activity:    Days per week: Not on file    Minutes per session: Not on file  . Stress: Not on file  Relationships  . Social connections:    Talks on phone: Not on file    Gets together: Not on file    Attends religious service: Not on file    Active member of club or organization: Not on file    Attends meetings of clubs or organizations: Not on file    Relationship status: Not on file  Other Topics Concern  . Not on file  Social History Narrative  . Not on file    Past Medical History:  Diagnosis Date  . Carpal tunnel syndrome 11/10/2008  . Diabetes mellitus without complication (HCC)   . GERD (gastroesophageal reflux disease)   . Gout   . History of chicken pox   . History of measles   . History of mumps   . Hypertension      Patient Active Problem List   Diagnosis Date Noted  . 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 Elevated PSA 05/21/2016  . Diabetes mellitus (HCC) 05/12/2015  . 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 Morbid obesity (HCC) 05/12/2015  .  Skin tag 05/12/2015  . Weight loss 05/12/2015  . GERD (gastroesophageal reflux disease) 10/22/2007  . History of tobacco use 10/22/2007  . Gout 02/03/2007  . HLD (hyperlipidemia) 09/24/2005  . Essential (primary) hypertension 06/03/2001    Past Surgical History:  Procedure Laterality Date  . APPENDECTOMY  1960's  . CARPAL TUNNEL RELEASE Left 10/02/2016   Procedure: CARPAL TUNNEL RELEASE;  Surgeon: Deeann SaintHoward Miller, MD;  Location: [facility] ORS;  Service: Orthopedics;  Laterality: Left;  . index finger surgery  1981    Family History        Family Status  Relation Name Status  . Mother  Deceased at age 61       septic shock  . Father  Deceased at age 61       accidental  . Brother  Alive  . Son  Alive  . MGM  Deceased at age 61's  . Other  (Not  Specified)        His family history includes Diabetes in his brother, mother, and another family member; Heart attack in his maternal grandmother; Hypertension in his mother.      Allergies  Allergen Reactions  . Naproxen Anaphylaxis    Onset 30minute after taken one naproxen on 07-01-17, although had taken naproxen many times before and had been on daily ECASA     Current Outpatient Medications:  .  aspirin 81 MG tablet, Take 1 tablet by mouth daily., Disp: , Rfl:  .  atorvastatin (LIPITOR) 40 MG tablet, TAKE ONE TABLET BY MOUTH DAILY, Disp: 30 tablet, Rfl: 1 .  bisoprolol (ZEBETA) 10 MG tablet, TAKE ONE TABLET BY MOUTH DAILY, Disp: 30 tablet, Rfl: 1 .  glucose blood (BAYER CONTOUR NEXT TEST) test strip, 1 strip daily. , Disp: , Rfl:  .  metFORMIN (GLUCOPHAGE-XR) 500 MG 24 hr tablet, TAKE ONE TABLET BY MOUTH TWICE A DAY, Disp: 60 tablet, Rfl: 1 .  Multiple Vitamin (MULTIVITAMIN) tablet, Take 1 tablet by mouth daily., Disp: , Rfl:  .  Omega-3 Fatty Acids (FISH OIL) 1000 MG CAPS, Take 2 capsules by mouth daily., Disp: , Rfl:  .  omeprazole (PRILOSEC) 40 MG capsule, TAKE ONE CAPSULE BY MOUTH DAILY, Disp: 30 capsule, Rfl: 5 .  predniSONE (DELTASONE) 10 MG tablet, Take 6 tabs (60 mg) PO x 3 days, then take 4 tabs (40 mg) PO x 3 days, then take 2 tabs (20 mg) PO x 3 days, then take 1 tab (10 mg) PO x 3 days, then take 1/2 tab (5 mg) PO x 4 days., Disp: 41 tablet, Rfl: 0 .  sildenafil (REVATIO) 20 MG tablet, TAKE 3-5 TABLETS AS NEEDED PRIOR TO INTERCOURSE .Marland Kitchen.NOT TO EXCEED 5 TABLETS IN A DAY, Disp: 50 tablet, Rfl: 4   Patient Care Team: Malva LimesFisher, Donald E, MD as PCP - General (Family Medicine)    Objective:    Vitals: There were no vitals taken for this visit.  There were no vitals filed for this visit.   Physical Exam   Depression Screen PHQ 2/9 Scores 05/09/2017 09/23/2016 05/15/2015  PHQ - 2 Score 0 0 0  PHQ- 9 Score 0 0 1       Assessment & Plan:     Routine Health Maintenance  and Physical Exam  Exercise Activities and Dietary recommendations Goals   None     Immunization History  Administered Date(s) Administered  . Influenza-Unspecified 04/03/2017  . Pneumococcal Polysaccharide-23 05/09/2017  . Td 03/28/1997  . Tdap 10/22/2007  Health Maintenance  Topic Date Due  . FOOT EXAM  03/02/1968  . HIV Screening  03/02/1973  . HEMOGLOBIN A1C  03/25/2017  . TETANUS/TDAP  10/21/2017  . URINE MICROALBUMIN  05/09/2018  . INFLUENZA VACCINE  01/02/2019  . OPHTHALMOLOGY EXAM  03/07/2019  . COLONOSCOPY  05/14/2022  . PNEUMOCOCCAL POLYSACCHARIDE VACCINE AGE 15-64 HIGH RISK  Completed  . Hepatitis C Screening  Completed     Discussed health benefits of physical activity, and encouraged him to engage in regular exercise appropriate for his age and condition.      Mila Merry, MD  [facility]

## 2018-10-16 NOTE — Progress Notes (Signed)
Patient: Jeffery Norman, Male    DOB: 12/29/1955, 63 y.o.   MRN: [id number] Visit Date: 10/16/2018  Today's Provider: Mila Merry, MD   Chief Complaint  Patient presents with  . Annual Exam   Subjective:    Annual physical exam Jeffery Norman is a 63 y.o. male who presents today for health maintenance and complete physical. He feels well. He reports exercising walking. He reports he is sleeping well.  ----------------------------------------------------------------- Last colonoscopy:05/14/2012, diverticulosis and repeat in 10 years   Diabetes Mellitus Type II, Follow-up:   Lab Results  Component Value Date   HGBA1C 5.8 (H) 09/23/2016   HGBA1C 5.6 05/20/2016   HGBA1C 5.7 (H) 05/15/2015    Last seen for diabetes 2 years ago.  Management since then includes continuing the same medications. Marland Kitchen He reports excellent compliance with treatment. He is not having side effects.   Home blood sugar records: not being checked.   Episodes of hypoglycemia? no     Pertinent Labs:    Component Value Date/Time   CHOL 170 05/09/2017 0959   CHOL 154 05/20/2016 0953   TRIG 124 05/09/2017 0959   HDL 48 05/09/2017 0959   HDL 46 05/20/2016 0953   LDLCALC 99 05/09/2017 0959   CREATININE 1.45 (H) 07/01/2017 1154   CREATININE 0.96 05/09/2017 0959    Wt Readings from Last 3 Encounters:  10/16/18 239 lb 12.8 oz (108.8 kg)  07/07/17 246 lb (111.6 kg)  07/01/17 235 lb (106.6 kg)    ------------------------------------------------------------------------  Hypertension, follow-up:  BP Readings from Last 3 Encounters:  10/16/18 (!) 181/85  07/07/17 120/74  07/01/17 (!) 158/98    He was last seen for hypertension 18 months ago.  BP at that visit was 136/80. Management since that visit includes continue the same medications. He reports good compliance with treatment. He is not having side effects.  He is exercising. He is adherent to low salt diet.   Outside blood  pressures are not being checked.  Patient denies chest pain, chest pressure/discomfort, dyspnea, exertional chest pressure/discomfort, irregular heart beat, palpitations, syncope and tachypnea.       Weight trend: stable Wt Readings from Last 3 Encounters:  10/16/18 239 lb 12.8 oz (108.8 kg)  07/07/17 246 lb (111.6 kg)  07/01/17 235 lb (106.6 kg)     ------------------------------------------------------------------------    Review of Systems  Constitutional: Negative.   HENT: Negative.   Eyes: Negative.   Respiratory: Negative.   Cardiovascular: Negative.   Gastrointestinal: Negative.   Endocrine: Negative.   Genitourinary: Negative.   Musculoskeletal: Negative.   Skin: Negative.   Allergic/Immunologic: Positive for environmental allergies.  Neurological: Negative.   Hematological: Negative.   Psychiatric/Behavioral: Negative.     Social History He  reports that he has been smoking e-cigarettes and cigarettes. He has a 12.50 pack-year smoking history. He has never used smokeless tobacco. He reports current alcohol use. He reports that he does not use drugs. Social History   Socioeconomic History  . Marital status: Married    Spouse name: Not on file  . Number of children: 1  . Years of education: Not on file  . Highest education level: Not on file  Occupational History  . Occupation: Chartered certified accountant  Social Needs  . Financial resource strain: Not on file  . Food insecurity:    Worry: Not on file    Inability: Not on file  . Transportation needs:    Medical: Not on file  Non-medical: Not on file  Tobacco Use  . Smoking status: Current Every Day Smoker    Packs/day: 0.50    Years: 25.00    Pack years: 12.50    Types: E-cigarettes, Cigarettes    Last attempt to quit: 08/02/2011    Years since quitting: 7.2  . Smokeless tobacco: Never Used  Substance and Sexual Activity  . Alcohol use: Yes    Alcohol/week: 0.0 standard drinks    Comment: moderate use  . Drug  use: No  . Sexual activity: Not on file  Lifestyle  . Physical activity:    Days per week: Not on file    Minutes per session: Not on file  . Stress: Not on file  Relationships  . Social connections:    Talks on phone: Not on file    Gets together: Not on file    Attends religious service: Not on file    Active member of club or organization: Not on file    Attends meetings of clubs or organizations: Not on file    Relationship status: Not on file  Other Topics Concern  . Not on file  Social History Narrative  . Not on file    Patient Active Problem List   Diagnosis Date Noted  . 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 Elevated PSA 05/21/2016  . Diabetes mellitus (HCC) 05/12/2015  . 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 Morbid obesity (HCC) 05/12/2015  . Skin tag 05/12/2015  . Weight loss 05/12/2015  . GERD (gastroesophageal reflux disease) 10/22/2007  . History of tobacco use 10/22/2007  . Gout 02/03/2007  . HLD (hyperlipidemia) 09/24/2005  . Essential (primary) hypertension 06/03/2001    Past Surgical History:  Procedure Laterality Date  . APPENDECTOMY  1960's  . CARPAL TUNNEL RELEASE Left 10/02/2016   Procedure: CARPAL TUNNEL RELEASE;  Surgeon: Deeann SaintHoward Miller, MD;  Location: [facility] ORS;  Service: Orthopedics;  Laterality: Left;  . index finger surgery  1981    Family History  Family Status  Relation Name Status  . Mother  Deceased at age 61       septic shock  . Father  Deceased at age 61       accidental  . Brother  Alive  . Son  Alive  . MGM  Deceased at age 61's  . Other  (Not Specified)   His family history includes Diabetes in his brother, mother, and another family member; Heart attack in his maternal grandmother; Hypertension in his mother.     Allergies  Allergen Reactions  . Naproxen Anaphylaxis    Onset 30minute after taken one naproxen on 07-01-17, although had taken naproxen many times before and had been on daily ECASA     Previous Medications   ASPIRIN 81 MG TABLET    Take 1 tablet by mouth daily.   ATORVASTATIN (LIPITOR) 40 MG TABLET    TAKE ONE TABLET BY MOUTH DAILY   BISOPROLOL (ZEBETA) 10 MG TABLET    TAKE ONE TABLET BY MOUTH DAILY   GLUCOSE BLOOD (BAYER CONTOUR NEXT TEST) TEST STRIP    1 strip daily.    METFORMIN (GLUCOPHAGE-XR) 500 MG 24 HR TABLET    TAKE ONE TABLET BY MOUTH TWICE A DAY   MULTIPLE VITAMIN (MULTIVITAMIN) TABLET    Take 1 tablet by mouth daily.   OMEGA-3 FATTY ACIDS (FISH OIL) 1000 MG CAPS    Take 2 capsules by mouth daily.   OMEPRAZOLE (PRILOSEC) 40 MG CAPSULE  TAKE ONE CAPSULE BY MOUTH DAILY   PREDNISONE (DELTASONE) 10 MG TABLET    Take 6 tabs (60 mg) PO x 3 days, then take 4 tabs (40 mg) PO x 3 days, then take 2 tabs (20 mg) PO x 3 days, then take 1 tab (10 mg) PO x 3 days, then take 1/2 tab (5 mg) PO x 4 days.   SILDENAFIL (REVATIO) 20 MG TABLET    TAKE 3-5 TABLETS AS NEEDED PRIOR TO INTERCOURSE .Marland KitchenNOT TO EXCEED 5 TABLETS IN A DAY    Patient Care Team: Malva Limes, MD as PCP - General (Family Medicine)      Objective:   Vitals: BP (!) 181/85 (BP Location: Right Arm, Patient Position: Sitting, Cuff Size: Large)   Pulse 63   Temp 99.2 F (37.3 C) (Oral)   Wt 239 lb 12.8 oz (108.8 kg)   BMI 35.41 kg/m     Physical Exam   General Appearance:    Alert, cooperative, no distress, appears stated age  Head:    Normocephalic, without obvious abnormality, atraumatic  Eyes:    PERRL, conjunctiva/corneas clear, EOM's intact, fundi    benign, both eyes       Ears:    Normal TM's and external ear canals, both ears  Nose:   Nares normal, septum midline, mucosa normal, no drainage   or sinus tenderness  Throat:   Lips, mucosa, and tongue normal; teeth and gums normal  Neck:   Supple, symmetrical, trachea midline, no adenopathy;       thyroid:  No enlargement/tenderness/nodules; no carotid   bruit or JVD  Back:     Symmetric, no curvature, ROM normal, no CVA tenderness  Lungs:      Clear to auscultation bilaterally, respirations unlabored  Chest wall:    No tenderness or deformity  Heart:    Regular rate and rhythm, S1 and S2 normal, no murmur, rub   or gallop  Abdomen:     Soft, non-tender, bowel sounds active all four quadrants,    no masses, no organomegaly  Genitalia:    deferred  Rectal:    deferred  Extremities:   Extremities normal, atraumatic, no cyanosis or edema  Pulses:   2+ and symmetric all extremities  Skin:   Skin color, texture, turgor normal, no rashes or lesions  Lymph nodes:   Cervical, supraclavicular, and axillary nodes normal  Neurologic:   CNII-XII intact. Normal strength, sensation and reflexes      throughout    Depression Screen PHQ 2/9 Scores 05/09/2017 09/23/2016 05/15/2015  PHQ - 2 Score 0 0 0  PHQ- 9 Score 0 0 1      Assessment & Plan:     Routine Health Maintenance and Physical Exam  Exercise Activities and Dietary recommendations Goals   None     Immunization History  Administered Date(s) Administered  . Influenza-Unspecified 04/03/2017  . Pneumococcal Polysaccharide-23 05/09/2017  . Td 03/28/1997  . Tdap 10/22/2007    Health Maintenance  Topic Date Due  . FOOT EXAM  03/02/1968  . HIV Screening  03/02/1973  . HEMOGLOBIN A1C  03/25/2017  . TETANUS/TDAP  10/21/2017  . URINE MICROALBUMIN  05/09/2018  . INFLUENZA VACCINE  01/02/2019  . OPHTHALMOLOGY EXAM  03/07/2019  . COLONOSCOPY  05/14/2022  . PNEUMOCOCCAL POLYSACCHARIDE VACCINE AGE 79-64 HIGH RISK  Completed  . Hepatitis C Screening  Completed     Discussed health benefits of physical activity, and encouraged him to engage in regular exercise  appropriate for his age and condition.    --------------------------------------------------------------------  1. Annual physical exam Doing well, unremarkable exam aside from elevated blood pressure as below.  - CBC with Differential/Platelet - Comprehensive metabolic panel  2. Need for prophylactic  vaccination using tetanus and diphtheria toxoids adsorbed (Td) vaccine  - Tdap vaccine greater than or equal to 7yo IM  3. Need for shingles vaccine  - Varicella-zoster vaccine IM  4. Prostate cancer screening  - PSA  5. Type 2 diabetes mellitus without complication, unspecified whether long term insulin use (HCC) Ding well on metformin but does have mild microalbuminuria. Consider starting ACEI or ARB after reviewing labs.  - Hemoglobin A1c - POCT UA - Microalbumin  6. Essential (primary) hypertension Uncontrolled, consider addition of ACEI or ARB as above.  - CBC with Differential/Platelet - Comprehensive metabolic panel  7. Hyperlipidemia, unspecified hyperlipidemia type He is tolerating atorvastatin well with no adverse effects.   - Comprehensive metabolic panel - Lipid panel  The entirety of the information documented in the History of Present Illness, Review of Systems and Physical Exam were personally obtained by me. Portions of this information were initially documented by [facility] CMA,and reviewed by me for thoroughness and accuracy.

## 2018-10-16 NOTE — Patient Instructions (Signed)
.   Please review the attached list of medications and notify my office if there are any errors.   . Please bring all of your medications to every appointment so we can make sure that our medication list is the same as yours.   

## 2018-10-17 LAB — COMPREHENSIVE METABOLIC PANEL
ALT: 27 IU/L (ref 0–44)
AST: 23 IU/L (ref 0–40)
Albumin/Globulin Ratio: 2.2 (ref 1.2–2.2)
Albumin: 4.8 g/dL (ref 3.8–4.9)
Alkaline Phosphatase: 79 IU/L (ref 39–117)
BUN/Creatinine Ratio: 18 (ref 10–24)
BUN: 18 mg/dL (ref 8–27)
Bilirubin Total: 0.6 mg/dL (ref 0.0–1.2)
CO2: 23 mmol/L (ref 20–29)
Calcium: 9.6 mg/dL (ref 8.6–10.2)
Chloride: 102 mmol/L (ref 96–106)
Creatinine, Ser: 1.02 mg/dL (ref 0.76–1.27)
GFR calc Af Amer: 92 mL/min/{1.73_m2} (ref >59)
GFR calc non Af Amer: 80 mL/min/{1.73_m2} (ref >59)
Globulin, Total: 2.2 g/dL (ref 1.5–4.5)
Glucose: 103 mg/dL — ABNORMAL HIGH (ref 65–99)
Potassium: 4.3 mmol/L (ref 3.5–5.2)
Sodium: 141 mmol/L (ref 134–144)
Total Protein: 7 g/dL (ref 6.0–8.5)

## 2018-10-17 LAB — CBC WITH DIFFERENTIAL/PLATELET
Basophils Absolute: 0 10*3/uL (ref 0.0–0.2)
Basos: 1 %
EOS (ABSOLUTE): 0.3 10*3/uL (ref 0.0–0.4)
Eos: 4 %
Hematocrit: 40.7 % (ref 37.5–51.0)
Hemoglobin: 14.4 g/dL (ref 13.0–17.7)
Immature Grans (Abs): 0 10*3/uL (ref 0.0–0.1)
Immature Granulocytes: 0 %
Lymphocytes Absolute: 1.1 10*3/uL (ref 0.7–3.1)
Lymphs: 17 %
MCH: 31.8 pg (ref 26.6–33.0)
MCHC: 35.4 g/dL (ref 31.5–35.7)
MCV: 90 fL (ref 79–97)
Monocytes Absolute: 0.5 10*3/uL (ref 0.1–0.9)
Monocytes: 8 %
Neutrophils Absolute: 4.6 10*3/uL (ref 1.4–7.0)
Neutrophils: 70 %
Platelets: 171 10*3/uL (ref 150–450)
RBC: 4.53 x10E6/uL (ref 4.14–5.80)
RDW: 13.4 % (ref 11.6–15.4)
WBC: 6.5 10*3/uL (ref 3.4–10.8)

## 2018-10-17 LAB — PSA: Prostate Specific Ag, Serum: 0.9 ng/mL (ref 0.0–4.0)

## 2018-10-17 LAB — HEMOGLOBIN A1C
Est. average glucose Bld gHb Est-mCnc: 126 mg/dL
Hgb A1c MFr Bld: 6 % — ABNORMAL HIGH (ref 4.8–5.6)

## 2018-10-17 LAB — LIPID PANEL
Chol/HDL Ratio: 3.8 ratio (ref 0.0–5.0)
Cholesterol, Total: 172 mg/dL (ref 100–199)
HDL: 45 mg/dL (ref >39)
LDL Calculated: 105 mg/dL — ABNORMAL HIGH (ref 0–99)
Triglycerides: 112 mg/dL (ref 0–149)
VLDL Cholesterol Cal: 22 mg/dL (ref 5–40)

## 2018-10-19 ENCOUNTER — Telehealth: Payer: Self-pay

## 2018-10-19 DIAGNOSIS — I10 Essential (primary) hypertension: Secondary | ICD-10-CM

## 2018-10-19 MED ORDER — LISINOPRIL-HYDROCHLOROTHIAZIDE 10-12.5 MG PO TABS: 1.0000 | ORAL_TABLET | Freq: Every day | ORAL | 0 refills | Status: DC

## 2018-10-19 NOTE — Telephone Encounter (Signed)
Patient notified of results, medication sent to the pharmacy, and 3 week follow up appointment scheduled.

## 2018-10-21 ENCOUNTER — Other Ambulatory Visit: Payer: Self-pay | Admitting: Family Medicine

## 2018-11-16 ENCOUNTER — Other Ambulatory Visit: Payer: Self-pay

## 2018-11-16 ENCOUNTER — Ambulatory Visit (INDEPENDENT_AMBULATORY_CARE_PROVIDER_SITE_OTHER): Payer: BC Managed Care – PPO | Admitting: Family Medicine

## 2018-11-16 ENCOUNTER — Encounter: Payer: Self-pay | Admitting: Family Medicine

## 2018-11-16 VITALS — BP 138/80 | HR 84 | Temp 98.5°F | Resp 16 | Wt 239.0 lb

## 2018-11-16 DIAGNOSIS — I10 Essential (primary) hypertension: Secondary | ICD-10-CM | POA: Diagnosis not present

## 2018-11-16 NOTE — Progress Notes (Signed)
Patient: Jeffery Norman Male    DOB: 11/12/1955   63 y.o.   MRN: [id number] Visit Date: 11/16/2018  Today's Provider: Mila Merryonald Fisher, MD   Chief Complaint  Patient presents with  . Hypertension   Subjective:     HPI    Hypertension, follow-up:  BP Readings from Last 3 Encounters:  11/16/18 138/80  10/16/18 (!) 181/85  07/07/17 120/74    He was last seen for hypertension 3 weeks ago.  BP at that visit was 181/85. Management since that visit includes starting lisinopril/HCTZ 10/12.5mg . He reports excellent compliance with treatment. He is not having side effects.  He is exercising. He is adherent to low salt diet.   Outside blood pressures are 100-130/6080's. He is experiencing fatigue.  Patient denies chest pain, dyspnea, exertional chest pressure/discomfort and lower extremity edema.   Cardiovascular risk factors include advanced age (older than 6255 for men, 3465 for women), hypertension and male gender.  Use of agents associated with hypertension: none.     Weight trend: stable Wt Readings from Last 3 Encounters:  11/16/18 239 lb (108.4 kg)  10/16/18 239 lb 12.8 oz (108.8 kg)  07/07/17 246 lb (111.6 kg)    Current diet: in general, a "healthy" diet    ------------------------------------------------------------------------    Allergies  Allergen Reactions  . Naproxen Anaphylaxis    Onset 30minute after taken one naproxen on 07-01-17, although had taken naproxen many times before and had been on daily ECASA     Current Outpatient Medications:  .  aspirin 81 MG tablet, Take 1 tablet by mouth daily., Disp: , Rfl:  .  atorvastatin (LIPITOR) 40 MG tablet, TAKE ONE TABLET BY MOUTH DAILY, Disp: 30 tablet, Rfl: 3 .  bisoprolol (ZEBETA) 10 MG tablet, TAKE ONE TABLET BY MOUTH DAILY, Disp: 30 tablet, Rfl: 1 .  glucose blood (BAYER CONTOUR NEXT TEST) test strip, 1 strip daily. , Disp: , Rfl:  .  lisinopril-hydrochlorothiazide (ZESTORETIC) 10-12.5 MG tablet,  Take 1 tablet by mouth daily., Disp: 30 tablet, Rfl: 0 .  metFORMIN (GLUCOPHAGE-XR) 500 MG 24 hr tablet, TAKE ONE TABLET BY MOUTH TWO TIMES A DAY, Disp: 60 tablet, Rfl: 3 .  Multiple Vitamin (MULTIVITAMIN) tablet, Take 1 tablet by mouth daily., Disp: , Rfl:  .  Omega-3 Fatty Acids (FISH OIL) 1000 MG CAPS, Take 2 capsules by mouth daily., Disp: , Rfl:  .  omeprazole (PRILOSEC) 40 MG capsule, TAKE ONE CAPSULE BY MOUTH DAILY, Disp: 30 capsule, Rfl: 5 .  sildenafil (REVATIO) 20 MG tablet, TAKE 3-5 TABLETS AS NEEDED PRIOR TO INTERCOURSE .Marland Kitchen.NOT TO EXCEED 5 TABLETS IN A DAY, Disp: 50 tablet, Rfl: 4  Review of Systems  Constitutional: Positive for fatigue. Negative for activity change, appetite change, chills, diaphoresis, fever and unexpected weight change.  Respiratory: Negative.   Cardiovascular: Negative.   Gastrointestinal: Negative.   Neurological: Negative for dizziness, light-headedness and headaches.    Social History   Tobacco Use  . Smoking status: Current Every Day Smoker    Packs/day: 0.50    Years: 25.00    Pack years: 12.50    Types: E-cigarettes, Cigarettes    Last attempt to quit: 08/02/2011    Years since quitting: 7.2  . Smokeless tobacco: Never Used  Substance Use Topics  . Alcohol use: Yes    Alcohol/week: 0.0 standard drinks    Comment: moderate use      Objective:   BP 138/80 (BP Location: Right Arm, Patient Position: Sitting,  Cuff Size: Large)   Pulse 84   Temp 98.5 F (36.9 C) (Oral)   Resp 16   Wt 239 lb (108.4 kg)   BMI 35.29 kg/m  Vitals:   11/16/18 1532  BP: 138/80  Pulse: 84  Resp: 16  Temp: 98.5 F (36.9 C)  TempSrc: Oral  Weight: 239 lb (108.4 kg)     Physical Exam  General appearance: alert, well developed, well nourished, cooperative and in no distress Head: Normocephalic, without obvious abnormality, atraumatic Respiratory: Respirations even and unlabored, normal respiratory rate Extremities: No gross deformities Skin: Skin color,  texture, turgor normal. No rashes seen  Psych: Appropriate mood and affect. Neurologic: Mental status: Alert, oriented to person, place, and time, thought content appropriate.     Assessment & Plan    1. Essential (primary) hypertension Much better on lisinopril-hctz, tolerating well.  - Renal function panel  Will send 90 day prescription to Jeffery Norman if labs normal.     Lelon Huh, MD  [facility]

## 2018-11-16 NOTE — Patient Instructions (Signed)
.   Please review the attached list of medications and notify my office if there are any errors.   . Please bring all of your medications to every appointment so we can make sure that our medication list is the same as yours.   

## 2018-11-17 ENCOUNTER — Other Ambulatory Visit: Payer: Self-pay | Admitting: Family Medicine

## 2018-11-17 DIAGNOSIS — I10 Essential (primary) hypertension: Secondary | ICD-10-CM

## 2018-11-17 LAB — RENAL FUNCTION PANEL
Albumin: 4.8 g/dL (ref 3.8–4.9)
BUN/Creatinine Ratio: 17 (ref 10–24)
BUN: 19 mg/dL (ref 8–27)
CO2: 22 mmol/L (ref 20–29)
Calcium: 9.1 mg/dL (ref 8.6–10.2)
Chloride: 98 mmol/L (ref 96–106)
Creatinine, Ser: 1.09 mg/dL (ref 0.76–1.27)
GFR calc Af Amer: 85 mL/min/{1.73_m2} (ref >59)
GFR calc non Af Amer: 73 mL/min/{1.73_m2} (ref >59)
Glucose: 95 mg/dL (ref 65–99)
Phosphorus: 3.4 mg/dL (ref 2.8–4.1)
Potassium: 4 mmol/L (ref 3.5–5.2)
Sodium: 137 mmol/L (ref 134–144)

## 2018-11-17 MED ORDER — LISINOPRIL-HYDROCHLOROTHIAZIDE 10-12.5 MG PO TABS: 1.0000 | ORAL_TABLET | Freq: Every day | ORAL | 3 refills | Status: DC

## 2018-11-18 ENCOUNTER — Telehealth: Payer: Self-pay | Admitting: Family Medicine

## 2018-11-18 DIAGNOSIS — I10 Essential (primary) hypertension: Secondary | ICD-10-CM

## 2018-11-18 MED ORDER — LISINOPRIL-HYDROCHLOROTHIAZIDE 20-25 MG PO TABS: ORAL_TABLET | ORAL | 2 refills | Status: DC

## 2018-11-18 NOTE — Telephone Encounter (Signed)
Please advise pt we got note from pharmacy that lisinopril-hctz 10-12.5 is on backorder, so I changed to 20/25 and he should only take 1/2 tablet daily.

## 2019-01-23 ENCOUNTER — Other Ambulatory Visit: Payer: Self-pay | Admitting: Family Medicine

## 2019-02-21 ENCOUNTER — Other Ambulatory Visit: Payer: Self-pay | Admitting: Family Medicine

## 2019-03-11 ENCOUNTER — Other Ambulatory Visit: Payer: Self-pay | Admitting: Family Medicine

## 2019-03-11 DIAGNOSIS — N529 Male erectile dysfunction, unspecified: Secondary | ICD-10-CM

## 2019-03-26 ENCOUNTER — Other Ambulatory Visit: Payer: Self-pay

## 2019-03-26 ENCOUNTER — Ambulatory Visit (INDEPENDENT_AMBULATORY_CARE_PROVIDER_SITE_OTHER): Payer: BC Managed Care – PPO | Admitting: Family Medicine

## 2019-03-26 DIAGNOSIS — Z23 Encounter for immunization: Secondary | ICD-10-CM

## 2019-03-26 NOTE — Patient Instructions (Signed)
.   Please review the attached list of medications and notify my office if there are any errors.   . Please bring all of your medications to every appointment so we can make sure that our medication list is the same as yours.   . It is especially important to get the annual flu vaccine this year. If you haven't had it already, please go to your pharmacy or call the office as soon as possible to schedule you flu shot.  

## 2019-05-29 ENCOUNTER — Other Ambulatory Visit: Payer: Self-pay | Admitting: Family Medicine

## 2019-07-29 ENCOUNTER — Other Ambulatory Visit: Payer: Self-pay | Admitting: Family Medicine

## 2019-08-19 ENCOUNTER — Other Ambulatory Visit: Payer: Self-pay | Admitting: Family Medicine

## 2019-08-19 NOTE — Telephone Encounter (Signed)
Requested medication (s) are due for refill today -yes  Requested medication (s) are on the active medication list -yes  Future visit scheduled -yes  Last refill: 05/13/19  Notes to clinic: Patient has future appointment in May for annual- he did not have 6 month medication/lab follow up. Sent for review- do RF or call patient for appointment- 9 months out now. Sent for PCP review  Requested Prescriptions  Pending Prescriptions Disp Refills   lisinopril-hydrochlorothiazide (ZESTORETIC) 20-25 MG tablet [Pharmacy Med Name: LISINOPRIL-HCTZ 20-25 MG TAB] 45 tablet 1    Sig: TAKE 1/2 TABLET BY MOUTH DAILY      Cardiovascular:  ACEI + Diuretic Combos Failed - 08/19/2019 12:10 PM      Failed - Na in normal range and within 180 days    Sodium  Date Value Ref Range Status  11/16/2018 137 134 - 144 mmol/L Final          Failed - K in normal range and within 180 days    Potassium  Date Value Ref Range Status  11/16/2018 4.0 3.5 - 5.2 mmol/L Final          Failed - Cr in normal range and within 180 days    Creat  Date Value Ref Range Status  05/09/2017 0.96 0.70 - 1.33 mg/dL Final    Comment:    For patients >38 years of age, the reference limit for Creatinine is approximately 13% higher for people identified as African-American. .    Creatinine, Ser  Date Value Ref Range Status  11/16/2018 1.09 0.76 - 1.27 mg/dL Final   Creatinine, POC  Date Value Ref Range Status  05/09/2017 n/a mg/dL Final          Failed - Ca in normal range and within 180 days    Calcium  Date Value Ref Range Status  11/16/2018 9.1 8.6 - 10.2 mg/dL Final          Failed - Valid encounter within last 6 months    Recent Outpatient Visits           4 months ago Need for shingles vaccine   [facility] Malva Limes, MD   9 months ago Essential (primary) hypertension   [facility] Fisher, Demetrios Isaacs, MD   10 months ago Annual physical exam   [facility] Malva Limes, MD   2 years ago Anaphylaxis, initial encounter   [facility] Malva Limes, MD   2 years ago Annual physical exam   [facility] Malva Limes, MD              Passed - Patient is not pregnant      Passed - Last BP in normal range    BP Readings from Last 1 Encounters:  11/16/18 138/80              Requested Prescriptions  Pending Prescriptions Disp Refills   lisinopril-hydrochlorothiazide (ZESTORETIC) 20-25 MG tablet [Pharmacy Med Name: LISINOPRIL-HCTZ 20-25 MG TAB] 45 tablet 1    Sig: TAKE 1/2 TABLET BY MOUTH DAILY      Cardiovascular:  ACEI + Diuretic Combos Failed - 08/19/2019 12:10 PM      Failed - Na in normal range and within 180 days    Sodium  Date Value Ref Range Status  11/16/2018 137 134 - 144 mmol/L Final          Failed - K in normal range and within 180  days    Potassium  Date Value Ref Range Status  11/16/2018 4.0 3.5 - 5.2 mmol/L Final          Failed - Cr in normal range and within 180 days    Creat  Date Value Ref Range Status  05/09/2017 0.96 0.70 - 1.33 mg/dL Final    Comment:    For patients >52 years of age, the reference limit for Creatinine is approximately 13% higher for people identified as African-American. .    Creatinine, Ser  Date Value Ref Range Status  11/16/2018 1.09 0.76 - 1.27 mg/dL Final   Creatinine, POC  Date Value Ref Range Status  05/09/2017 n/a mg/dL Final          Failed - Ca in normal range and within 180 days    Calcium  Date Value Ref Range Status  11/16/2018 9.1 8.6 - 10.2 mg/dL Final          Failed - Valid encounter within last 6 months    Recent Outpatient Visits           4 months ago Need for shingles vaccine   [facility] Birdie Sons, MD   9 months ago Essential (primary) hypertension   [facility] Fisher, Kirstie Peri, MD   10 months ago Annual physical exam   [facility]  Birdie Sons, MD   2 years ago Anaphylaxis, initial encounter   [facility] Birdie Sons, MD   2 years ago Annual physical exam   [place], [place]              Passed - Patient is not pregnant      Passed - Last BP in normal range    BP Readings from Last 1 Encounters:  11/16/18 138/80

## 2019-10-19 NOTE — Progress Notes (Signed)
Complete physical exam   Patient: Jeffery BridegroomKeith A Forse   DOB: 1956/04/24   64 y.o. Male  MRN: [id number] Visit Date: 10/20/2019  Today's healthcare provider: Mila Merryonald Tallan Sandoz, MD   Chief Complaint  Patient presents with  . Annual Exam  . Hypertension  . Diabetes  . Hyperlipidemia   Subjective    Jeffery Norman is a 64 y.o. male who presents today for a complete physical exam.  He reports consuming a general diet. Home exercise routine includes walking. He generally feels fairly well. He reports sleeping poorly. He does have additional problems to discuss today.  HPI  Hypertension, follow-up  BP Readings from Last 3 Encounters:  10/20/19 128/80  11/16/18 138/80  10/16/18 (!) 181/85   Wt Readings from Last 3 Encounters:  10/20/19 243 lb (110.2 kg)  11/16/18 239 lb (108.4 kg)  10/16/18 239 lb 12.8 oz (108.8 kg)     He was last seen for hypertension 11 months ago.  BP at that visit was 138/80. Management since that visit includes continuing same medication.  He reports good compliance with treatment. He is not having side effects.  He is following a Regular diet. He is exercising. He does not smoke.  Use of agents associated with hypertension: NSAIDS.   Outside blood pressures are not checked. Symptoms: No chest pain No chest pressure  No palpitations No syncope  No dyspnea No orthopnea  No paroxysmal nocturnal dyspnea No lower extremity edema   Pertinent labs: Lab Results  Component Value Date   CHOL 172 10/16/2018   HDL 45 10/16/2018   LDLCALC 105 (H) 10/16/2018   TRIG 112 10/16/2018   CHOLHDL 3.8 10/16/2018   Lab Results  Component Value Date   NA 137 11/16/2018   K 4.0 11/16/2018   CREATININE 1.09 11/16/2018   GFRNONAA 73 11/16/2018   GFRAA 85 11/16/2018   GLUCOSE 95 11/16/2018     The 10-year ASCVD risk score Denman George(Goff DC Jr., et al., 2013) is: 18.9%    --------------------------------------------------------------------------------------------------- Diabetes Mellitus Type II, Follow-up  Lab Results  Component Value Date   HGBA1C 6.0 (H) 10/16/2018   HGBA1C 5.8 (H) 09/23/2016   HGBA1C 5.6 05/20/2016   Wt Readings from Last 3 Encounters:  10/20/19 243 lb (110.2 kg)  11/16/18 239 lb (108.4 kg)  10/16/18 239 lb 12.8 oz (108.8 kg)   Last seen for diabetes 1 years ago.  Management since then includes starting an ACEI . He reports good compliance with treatment. He is not having side effects.  Symptoms: No fatigue No foot ulcerations  No appetite changes No nausea  No paresthesia of the feet  No polydipsia  No polyuria No visual disturbances   No vomiting     Home blood sugar records: blood sugars are not checked  Episodes of hypoglycemia? No    Current insulin regiment: none Most Recent Eye Exam: not UTD Current exercise: walking Current diet habits: in general, an "unhealthy" diet  Pertinent Labs: Lab Results  Component Value Date   CHOL 172 10/16/2018   HDL 45 10/16/2018   LDLCALC 105 (H) 10/16/2018   TRIG 112 10/16/2018   CHOLHDL 3.8 10/16/2018   Lab Results  Component Value Date   NA 137 11/16/2018   K 4.0 11/16/2018   CREATININE 1.09 11/16/2018   GFRNONAA 73 11/16/2018   GFRAA 85 11/16/2018   GLUCOSE 95 11/16/2018     --------------------------------------------------------------------------------------------------- Lipid/Cholesterol, Follow-up  Last lipid panel Other pertinent labs  Lab Results  Component Value Date   CHOL 172 10/16/2018   HDL 45 10/16/2018   LDLCALC 105 (H) 10/16/2018   TRIG 112 10/16/2018   CHOLHDL 3.8 10/16/2018   Lab Results  Component Value Date   ALT 27 10/16/2018   AST 23 10/16/2018   PLT 171 10/16/2018   TSH 1.84 05/09/2017     He was last seen for this 1 years ago.  Management since that visit includes continuing same medication.  He reports good compliance  with treatment. He is not having side effects.   Symptoms: No chest pain No chest pressure/discomfort  No dyspnea No lower extremity edema  No numbness or tingling of extremity No orthopnea  No palpitations No paroxysmal nocturnal dyspnea  No speech difficulty No syncope   Current diet: in general, an "unhealthy" diet Current exercise: walking  The 10-year ASCVD risk score Denman George DC Jr., et al., 2013) is: 18.9%  --------------------------------------------------------------------------------------------------- Also complains itchy bump off and on for about 6 months. Usually lasts a couple of weeks. Usually comes back same area.   Past Medical History:  Diagnosis Date  . Carpal tunnel syndrome 11/10/2008  . Diabetes mellitus without complication (HCC)   . GERD (gastroesophageal reflux disease)   . Gout   . History of chicken pox   . History of measles   . History of mumps   . Hypertension    Past Surgical History:  Procedure Laterality Date  . APPENDECTOMY  1960's  . CARPAL TUNNEL RELEASE Left 10/02/2016   Procedure: CARPAL TUNNEL RELEASE;  Surgeon: Deeann Saint, MD;  Location: [facility] ORS;  Service: Orthopedics;  Laterality: Left;  . index finger surgery  1981   Social History   Socioeconomic History  . Marital status: Married    Spouse name: Not on file  . Number of children: 1  . Years of education: Not on file  . Highest education level: Not on file  Occupational History  . Occupation: Chartered certified accountant  Tobacco Use  . Smoking status: Former Smoker    Packs/day: 0.50    Years: 25.00    Pack years: 12.50    Types: E-cigarettes, Cigarettes    Quit date: 08/02/2011    Years since quitting: 8.2  . Smokeless tobacco: Never Used  Substance and Sexual Activity  . Alcohol use: Yes    Alcohol/week: 0.0 standard drinks    Comment: moderate use  . Drug use: No  . Sexual activity: Not on file  Other Topics Concern  . Not on file  Social History Narrative  . Not on file    Social Determinants of Health   Financial Resource Strain:   . Difficulty of Paying Living Expenses:   Food Insecurity:   . Worried About Programme researcher, broadcasting/film/video in the Last Year:   . Barista in the Last Year:   Transportation Needs:   . Freight forwarder (Medical):   Marland Kitchen Lack of Transportation (Non-Medical):   Physical Activity:   . Days of Exercise per Week:   . Minutes of Exercise per Session:   Stress:   . Feeling of Stress :   Social Connections:   . Frequency of Communication with Friends and Family:   . Frequency of Social Gatherings with Friends and Family:   . Attends Religious Services:   . Active Member of Clubs or Organizations:   . Attends Banker Meetings:   Marland Kitchen Marital Status:   Intimate Partner Violence:   . Fear of Current or  Ex-Partner:   . Emotionally Abused:   Marland Kitchen Physically Abused:   . Sexually Abused:    Family Status  Relation Name Status  . Mother  Deceased at age 52       septic shock  . Father  Deceased at age 25       accidental  . Brother  Alive  . Son  Alive  . MGM  Deceased at age 15's  . Other  (Not Specified)   Family History  Problem Relation Age of Onset  . Diabetes Mother   . Hypertension Mother   . Diabetes Brother   . Heart attack Maternal Grandmother   . Diabetes Other    Allergies  Allergen Reactions  . Naproxen Anaphylaxis    Onset 56minute after taken one naproxen on 07-01-17, although had taken naproxen many times before and had been on daily ECASA    Patient Care Team: Birdie Sons, MD as PCP - General (Family Medicine)   Medications: Outpatient Medications Prior to Visit  Medication Sig  . aspirin 81 MG tablet Take 1 tablet by mouth daily.  Marland Kitchen atorvastatin (LIPITOR) 40 MG tablet TAKE ONE TABLET BY MOUTH DAILY  . glucose blood (BAYER CONTOUR NEXT TEST) test strip 1 strip daily.   Marland Kitchen lisinopril-hydrochlorothiazide (ZESTORETIC) 20-25 MG tablet TAKE 1/2 TABLET BY MOUTH DAILY  . metFORMIN  (GLUCOPHAGE-XR) 500 MG 24 hr tablet TAKE ONE TABLET BY MOUTH TWICE A DAY  . Multiple Vitamin (MULTIVITAMIN) tablet Take 1 tablet by mouth daily.  . Omega-3 Fatty Acids (FISH OIL) 1000 MG CAPS Take 2 capsules by mouth daily.  Marland Kitchen omeprazole (PRILOSEC) 40 MG capsule TAKE ONE CAPSULE BY MOUTH DAILY  . sildenafil (REVATIO) 20 MG tablet TAKE 3-5 TABLETS AS NEEDED PRIOR TO INTERCOURSE. NOT TO EXCEED 5 TABLETS IN A DAY.  . [DISCONTINUED] bisoprolol (ZEBETA) 10 MG tablet TAKE ONE TABLET BY MOUTH DAILY   No facility-administered medications prior to visit.    Review of Systems  Constitutional: Negative for appetite change, chills, fatigue and fever.  HENT: Negative for congestion, ear pain, hearing loss, nosebleeds and trouble swallowing.   Eyes: Negative for pain and visual disturbance.  Respiratory: Negative for cough, chest tightness and shortness of breath.   Cardiovascular: Negative for chest pain, palpitations and leg swelling.  Gastrointestinal: Negative for abdominal pain, blood in stool, constipation, diarrhea, nausea and vomiting.  Endocrine: Negative for polydipsia, polyphagia and polyuria.  Genitourinary: Negative for dysuria and flank pain.  Musculoskeletal: Negative for arthralgias, back pain, joint swelling, myalgias and neck stiffness.  Skin: Positive for rash. Negative for color change and wound.  Neurological: Negative for dizziness, tremors, seizures, speech difficulty, weakness, light-headedness and headaches.  Psychiatric/Behavioral: Negative for behavioral problems, confusion, decreased concentration, dysphoric mood and sleep disturbance. The patient is not nervous/anxious.   All other systems reviewed and are negative.     Objective    BP 128/80 (BP Location: Left Arm, Patient Position: Sitting, Cuff Size: Large)   Pulse 72   Temp 97.7 F (36.5 C) (Temporal)   Resp 16   Ht 5\' 9"  (1.753 m)   Wt 243 lb (110.2 kg)   SpO2 99% Comment: room air  BMI 35.88 kg/m     Physical Exam   General Appearance:    Obese male. Alert, cooperative, in no acute distress, appears stated age  Head:    Normocephalic, without obvious abnormality, atraumatic  Eyes:    PERRL, conjunctiva/corneas clear, EOM's intact, fundi    benign, both  eyes       Ears:    Normal TM's and external ear canals, both ears  Nose:   Nares normal, septum midline, mucosa normal, no drainage   or sinus tenderness  Throat:   Lips, mucosa, and tongue normal; teeth and gums normal  Neck:   Supple, symmetrical, trachea midline, no adenopathy;       thyroid:  No enlargement/tenderness/nodules; no carotid   bruit or JVD  Back:     Symmetric, no curvature, ROM normal, no CVA tenderness  Lungs:     Clear to auscultation bilaterally, respirations unlabored  Chest wall:    No tenderness or deformity  Heart:    Normal heart rate. Normal rhythm. No murmurs, rubs, or gallops.  S1 and S2 normal  Abdomen:     Soft, non-tender, bowel sounds active all four quadrants,    no masses, no organomegaly  Genitalia:    deferred  Rectal:    deferred  Extremities:   All extremities are intact. No cyanosis or edema  Pulses:   2+ and symmetric all extremities  Skin:   Skin color, texture, turgor normal, about 2cm x 1cm ill defined area or erythema with early vesicular lesions noted on left buttocks.   Lymph nodes:   Cervical, supraclavicular, and axillary nodes normal  Neurologic:   CNII-XII intact. Normal strength, sensation and reflexes      throughout     Depression Screen  PHQ 2/9 Scores 10/20/2019 10/16/2018 05/09/2017  PHQ - 2 Score 0 0 0  PHQ- 9 Score - 0 0    No results found for any visits on 10/20/19.  Assessment & Plan    Routine Health Maintenance and Physical Exam  Exercise Activities and Dietary recommendations Goals   None     Immunization History  Administered Date(s) Administered  . Influenza,inj,Quad PF,6+ Mos 03/26/2019  . Influenza-Unspecified 04/03/2017  . Pneumococcal  Polysaccharide-23 05/09/2017  . Td 03/28/1997  . Tdap 10/22/2007, 10/16/2018  . Zoster Recombinat (Shingrix) 10/16/2018, 03/26/2019    Health Maintenance  Topic Date Due  . FOOT EXAM  Never done  . HIV Screening  Never done  . COVID-19 Vaccine (1) Never done  . OPHTHALMOLOGY EXAM  03/07/2019  . HEMOGLOBIN A1C  04/18/2019  . INFLUENZA VACCINE  01/02/2020  . COLONOSCOPY  05/14/2022  . TETANUS/TDAP  10/15/2028  . PNEUMOCOCCAL POLYSACCHARIDE VACCINE AGE 67-64 HIGH RISK  Completed  . Hepatitis C Screening  Completed    Discussed health benefits of physical activity, and encouraged him to engage in regular exercise appropriate for his age and condition.  1. Annual physical exam Generally doing well. Counseled regarding prudent diet and regular exercise.    2. Encounter for special screening examination for cardiovascular disorder  - EKG 12-Lead  3. Encounter for screening for HIV  - HIV Antibody (routine testing w rflx)  4. Prostate cancer screening  - PSA Total (Reflex To Free) ([facility] only)  5. Type 2 diabetes mellitus with microalbuminuria, without long-term current use of insulin (HCC) Advised he is due to follow up  eye center for diabetic eye exam.  - Comprehensive metabolic panel - CBC - Lipid panel - Hemoglobin A1c  6. Morbid obesity (HCC) Counseled regarding prudent diet and regular exercise.    7. Viral exanthem Zoster versus HSV.  - valACYclovir (VALTREX) 1000 MG tablet; Take 1 tablet (1,000 mg total) by mouth every 8 (eight) hours. Take for up to 7 days as needed  Dispense: 42 tablet; Refill: 1  No follow-ups on file.     The entirety of the information documented in the History of Present Illness, Review of Systems and Physical Exam were personally obtained by me. Portions of this information were initially documented by the CMA and reviewed by me for thoroughness and accuracy.      Mila Merry, MD  [facility] [phone]  (phone) [phone] (fax)  [facility]

## 2019-10-20 ENCOUNTER — Ambulatory Visit (INDEPENDENT_AMBULATORY_CARE_PROVIDER_SITE_OTHER): Payer: BC Managed Care – PPO | Admitting: Family Medicine

## 2019-10-20 ENCOUNTER — Other Ambulatory Visit: Payer: Self-pay

## 2019-10-20 ENCOUNTER — Encounter: Payer: Self-pay | Admitting: Family Medicine

## 2019-10-20 VITALS — BP 128/80 | HR 72 | Temp 97.7°F | Resp 16 | Ht 69.0 in | Wt 243.0 lb

## 2019-10-20 DIAGNOSIS — Z114 Encounter for screening for human immunodeficiency virus [HIV]: Secondary | ICD-10-CM | POA: Diagnosis not present

## 2019-10-20 DIAGNOSIS — E1129 Type 2 diabetes mellitus with other diabetic kidney complication: Secondary | ICD-10-CM

## 2019-10-20 DIAGNOSIS — Z0000 Encounter for general adult medical examination without abnormal findings: Secondary | ICD-10-CM | POA: Diagnosis not present

## 2019-10-20 DIAGNOSIS — B09 Unspecified viral infection characterized by skin and mucous membrane lesions: Secondary | ICD-10-CM | POA: Diagnosis not present

## 2019-10-20 DIAGNOSIS — R809 Proteinuria, unspecified: Secondary | ICD-10-CM

## 2019-10-20 DIAGNOSIS — Z125 Encounter for screening for malignant neoplasm of prostate: Secondary | ICD-10-CM | POA: Diagnosis not present

## 2019-10-20 DIAGNOSIS — Z136 Encounter for screening for cardiovascular disorders: Secondary | ICD-10-CM

## 2019-10-20 MED ORDER — VALACYCLOVIR HCL 1 G PO TABS: 1000.0000 mg | ORAL_TABLET | Freq: Three times a day (TID) | ORAL | 1 refills | Status: DC

## 2019-10-20 NOTE — Patient Instructions (Signed)
.   Please review the attached list of medications and notify my office if there are any errors.   Please contact your eyecare professional to schedule a routine eye exam. I recommend seeing Dr. Edger House at [phone] or the [facility] at [phone]  It is recommended to engage in 150 minutes of moderate exercise every week.

## 2019-10-21 LAB — LIPID PANEL
Chol/HDL Ratio: 4.3 ratio (ref 0.0–5.0)
Cholesterol, Total: 176 mg/dL (ref 100–199)
HDL: 41 mg/dL (ref >39)
LDL Chol Calc (NIH): 112 mg/dL — ABNORMAL HIGH (ref 0–99)
Triglycerides: 127 mg/dL (ref 0–149)
VLDL Cholesterol Cal: 23 mg/dL (ref 5–40)

## 2019-10-21 LAB — CBC
Hematocrit: 44.2 % (ref 37.5–51.0)
Hemoglobin: 15.2 g/dL (ref 13.0–17.7)
MCH: 31 pg (ref 26.6–33.0)
MCHC: 34.4 g/dL (ref 31.5–35.7)
MCV: 90 fL (ref 79–97)
Platelets: 200 10*3/uL (ref 150–450)
RBC: 4.91 x10E6/uL (ref 4.14–5.80)
RDW: 13.2 % (ref 11.6–15.4)
WBC: 6.8 10*3/uL (ref 3.4–10.8)

## 2019-10-21 LAB — COMPREHENSIVE METABOLIC PANEL
ALT: 35 IU/L (ref 0–44)
AST: 27 IU/L (ref 0–40)
Albumin/Globulin Ratio: 1.6 (ref 1.2–2.2)
Albumin: 4.5 g/dL (ref 3.8–4.8)
Alkaline Phosphatase: 79 IU/L (ref 48–121)
BUN/Creatinine Ratio: 17 (ref 10–24)
BUN: 19 mg/dL (ref 8–27)
Bilirubin Total: 0.7 mg/dL (ref 0.0–1.2)
CO2: 23 mmol/L (ref 20–29)
Calcium: 9.4 mg/dL (ref 8.6–10.2)
Chloride: 98 mmol/L (ref 96–106)
Creatinine, Ser: 1.15 mg/dL (ref 0.76–1.27)
GFR calc Af Amer: 79 mL/min/{1.73_m2} (ref >59)
GFR calc non Af Amer: 68 mL/min/{1.73_m2} (ref >59)
Globulin, Total: 2.8 g/dL (ref 1.5–4.5)
Glucose: 110 mg/dL — ABNORMAL HIGH (ref 65–99)
Potassium: 4.3 mmol/L (ref 3.5–5.2)
Sodium: 137 mmol/L (ref 134–144)
Total Protein: 7.3 g/dL (ref 6.0–8.5)

## 2019-10-21 LAB — PSA TOTAL (REFLEX TO FREE): Prostate Specific Ag, Serum: 0.9 ng/mL (ref 0.0–4.0)

## 2019-10-21 LAB — HIV ANTIBODY (ROUTINE TESTING W REFLEX): HIV Screen 4th Generation wRfx: NONREACTIVE

## 2019-10-21 LAB — HEMOGLOBIN A1C
Est. average glucose Bld gHb Est-mCnc: 137 mg/dL
Hgb A1c MFr Bld: 6.4 % — ABNORMAL HIGH (ref 4.8–5.6)

## 2019-11-19 ENCOUNTER — Other Ambulatory Visit: Payer: Self-pay | Admitting: Family Medicine

## 2019-11-27 ENCOUNTER — Other Ambulatory Visit: Payer: Self-pay | Admitting: Family Medicine

## 2019-12-30 ENCOUNTER — Other Ambulatory Visit: Payer: Self-pay | Admitting: Family Medicine

## 2020-01-05 DIAGNOSIS — Z7984 Long term (current) use of oral hypoglycemic drugs: Secondary | ICD-10-CM | POA: Diagnosis not present

## 2020-01-05 DIAGNOSIS — H40023 Open angle with borderline findings, high risk, bilateral: Secondary | ICD-10-CM | POA: Diagnosis not present

## 2020-01-05 DIAGNOSIS — E119 Type 2 diabetes mellitus without complications: Secondary | ICD-10-CM | POA: Diagnosis not present

## 2020-01-05 DIAGNOSIS — H5203 Hypermetropia, bilateral: Secondary | ICD-10-CM | POA: Diagnosis not present

## 2020-01-05 LAB — HM DIABETES EYE EXAM

## 2020-02-17 ENCOUNTER — Other Ambulatory Visit: Payer: Self-pay | Admitting: Family Medicine

## 2020-02-19 ENCOUNTER — Encounter: Payer: Self-pay | Admitting: Family Medicine

## 2020-02-19 LAB — HM DIABETES EYE EXAM

## 2020-03-23 ENCOUNTER — Other Ambulatory Visit: Payer: Self-pay | Admitting: Family Medicine

## 2020-05-09 ENCOUNTER — Other Ambulatory Visit: Payer: Self-pay | Admitting: Family Medicine

## 2020-05-09 DIAGNOSIS — B09 Unspecified viral infection characterized by skin and mucous membrane lesions: Secondary | ICD-10-CM

## 2020-05-14 ENCOUNTER — Other Ambulatory Visit: Payer: Self-pay | Admitting: Family Medicine

## 2020-06-18 ENCOUNTER — Other Ambulatory Visit: Payer: Self-pay | Admitting: Family Medicine

## 2020-08-11 ENCOUNTER — Other Ambulatory Visit: Payer: Self-pay | Admitting: Family Medicine

## 2020-08-11 NOTE — Telephone Encounter (Signed)
Courtesy refill given, appointment needed. 

## 2020-08-11 NOTE — Telephone Encounter (Signed)
Patient called, left VM to return the call to the office to schedule an OV for follow up. Note in result notes to return for f/u. MyChart sent to call office to schedule appointment.  Good morning,   Your test results are back and should be available to view in MyChart. Your cholesterol is good at 176, but your average blood sugar is 137 which is up from 126 last year. See if you can improve your diet by eating less and strictly avoiding sweets and white starches with a goal of losing 20 pounds in the next 4 months. Please schedule an office visit in September or October so we can recheck your sugar and blood pressure. Feel free to contact my office if you have any questions or concerns.   Have a great day.  Dr. Sherrie Mustache  Written by Malva Limes, MD on 10/22/2019 7:58 AM EDT Seen by patient Blenda Bridegroom on 10/22/2019 8:02 AM

## 2020-09-11 ENCOUNTER — Ambulatory Visit: Payer: BC Managed Care – PPO | Admitting: Family Medicine

## 2020-09-11 ENCOUNTER — Other Ambulatory Visit: Payer: Self-pay

## 2020-09-11 VITALS — BP 132/85 | HR 71 | Temp 97.5°F | Ht 70.0 in | Wt 241.4 lb

## 2020-09-11 DIAGNOSIS — R079 Chest pain, unspecified: Secondary | ICD-10-CM

## 2020-09-11 DIAGNOSIS — R0600 Dyspnea, unspecified: Secondary | ICD-10-CM

## 2020-09-11 DIAGNOSIS — R0609 Other forms of dyspnea: Secondary | ICD-10-CM

## 2020-09-11 DIAGNOSIS — R809 Proteinuria, unspecified: Secondary | ICD-10-CM

## 2020-09-11 DIAGNOSIS — E1129 Type 2 diabetes mellitus with other diabetic kidney complication: Secondary | ICD-10-CM

## 2020-09-11 LAB — POCT GLYCOSYLATED HEMOGLOBIN (HGB A1C)
Estimated Average Glucose: 131
Hemoglobin A1C: 6.2 % — AB (ref 4.0–5.6)

## 2020-09-11 MED ORDER — ASPIRIN EC 81 MG PO TBEC: 162.0000 mg | DELAYED_RELEASE_TABLET | Freq: Every day | ORAL | Status: DC

## 2020-09-11 NOTE — Patient Instructions (Addendum)
.   Increase aspirin to two tablets daily and avoid strenuous exercise until your cardiac evaluation is completely.

## 2020-09-11 NOTE — Progress Notes (Signed)
Established patient visit   Patient: Jeffery Norman   DOB: January 11, 1956   65 y.o. Male  MRN: [id number] Visit Date: 09/11/2020  Today's healthcare provider: Mila Merry, MD   Chief Complaint  Patient presents with  . Numbness  . Chest Pain   Subjective    HPI  Upper extremity numbness: Patient complains of numbness of the left upper extremity when he walks and exerts himself for about 6 weeks now. Frequency is every time. He also feels pressure in his chest sometimes.  Diabetes Mellitus Type II, Follow-up  Lab Results  Component Value Date   HGBA1C 6.2 (A) 09/11/2020   HGBA1C 6.4 (H) 10/20/2019   HGBA1C 6.0 (H) 10/16/2018   Wt Readings from Last 3 Encounters:  09/11/20 241 lb 6.4 oz (109.5 kg)  10/20/19 243 lb (110.2 kg)  11/16/18 239 lb (108.4 kg)   Last seen for diabetes 11 months ago.  Management since then includes advising patient to improve diet by eating less and strictly avoiding sweets and white starches with a goal of losing 20 pounds in the next 4 months . He reports excellent compliance with treatment. He is not having side effects.  Symptoms: Yes fatigue No foot ulcerations  No appetite changes No nausea  No paresthesia of the feet  No polydipsia  No polyuria No visual disturbances   No vomiting     Home blood sugar records: 125  Episodes of hypoglycemia? No    Current insulin regiment: none Most Recent Eye Exam: 02/19/2020 Current exercise: walking Current diet habits: well balanced  Pertinent Labs: Lab Results  Component Value Date   CHOL 176 10/20/2019   HDL 41 10/20/2019   LDLCALC 112 (H) 10/20/2019   TRIG 127 10/20/2019   CHOLHDL 4.3 10/20/2019   Lab Results  Component Value Date   NA 137 10/20/2019   K 4.3 10/20/2019   CREATININE 1.15 10/20/2019   GFRNONAA 68 10/20/2019   GFRAA 79 10/20/2019   GLUCOSE 110 (H) 10/20/2019      ---------------------------------------------------------------------------------------------------     Medications: Outpatient Medications Prior to Visit  Medication Sig  . aspirin 81 MG tablet Take 1 tablet by mouth daily.  Marland Kitchen atorvastatin (LIPITOR) 40 MG tablet TAKE ONE TABLET BY MOUTH DAILY  . glucose blood test strip 1 strip daily.   Marland Kitchen lisinopril-hydrochlorothiazide (ZESTORETIC) 20-25 MG tablet TAKE 1/2 TABLET BY MOUTH DAILY. OFFICE VISIT NEEDED FOR ADDITIONAL REFILLS  . metFORMIN (GLUCOPHAGE-XR) 500 MG 24 hr tablet TAKE ONE TABLET BY MOUTH TWICE A DAY  . Multiple Vitamin (MULTIVITAMIN) tablet Take 1 tablet by mouth daily.  . Omega-3 Fatty Acids (FISH OIL) 1000 MG CAPS Take 2 capsules by mouth daily.  Marland Kitchen omeprazole (PRILOSEC) 40 MG capsule TAKE ONE CAPSULE BY MOUTH DAILY  . sildenafil (REVATIO) 20 MG tablet TAKE 3-5 TABLETS AS NEEDED PRIOR TO INTERCOURSE. NOT TO EXCEED 5 TABLETS IN A DAY.  . valACYclovir (VALTREX) 1000 MG tablet TAKE ONE TABLET BY MOUTH EVERY 8 HOURS FOR UP TO 7 DAYS AS NEEDED   No facility-administered medications prior to visit.        Objective    BP 132/85 (BP Location: Right Arm, Patient Position: Sitting, Cuff Size: Large)   Pulse 71   Temp (!) 97.5 F (36.4 C) (Temporal)   Ht 5\' 10"  (1.778 m)   Wt 241 lb 6.4 oz (109.5 kg)   BMI 34.64 kg/m     Physical Exam   General: Appearance:  Mildly obese male in no acute distress  Eyes:    PERRL, conjunctiva/corneas clear, EOM's intact       Lungs:     Clear to auscultation bilaterally, respirations unlabored  Heart:    Normal heart rate. Normal rhythm. No murmurs, rubs, or gallops.   MS:   All extremities are intact.   Neurologic:   Awake, alert, oriented x 3. No apparent focal neurological           defect.         Results for orders placed or performed in visit on 09/11/20  POCT HgB A1C  Result Value Ref Range   Hemoglobin A1C 6.2 (A) 4.0 - 5.6 %   Estimated Average Glucose 131    Resting  EKG without acute changes.   Assessment & Plan     1. Type 2 diabetes mellitus with microalbuminuria, without long-term current use of insulin (HCC) Very well controlled.   2. Chest pain, unspecified type  - EKG 12-Lead  3. Dyspnea on exertion  - Ambulatory referral to Cardiology  4. Chest pain, exertional Normal resting EKG, but exertional sx are suggestive of cardiac angina.  - Ambulatory referral to Cardiology  Increase from 1 a day to - aspirin EC 81 MG tablet; Take 2 tablets (162 mg total) by mouth daily. Swallow whole.         The entirety of the information documented in the History of Present Illness, Review of Systems and Physical Exam were personally obtained by me. Portions of this information were initially documented by the CMA and reviewed by me for thoroughness and accuracy.      Mila Merry, MD  [facility] [phone] (phone) [phone] (fax)  [facility]

## 2020-09-18 ENCOUNTER — Telehealth: Payer: Self-pay

## 2020-09-18 MED ORDER — OMEPRAZOLE 40 MG PO CPDR: 1.0000 | DELAYED_RELEASE_CAPSULE | Freq: Every day | ORAL | 1 refills | Status: DC

## 2020-09-18 NOTE — Telephone Encounter (Signed)
[facility] faxed refill request for the following medications:   omeprazole (PRILOSEC) 40 MG capsule    Please advise.  

## 2020-09-22 ENCOUNTER — Other Ambulatory Visit: Payer: Self-pay

## 2020-09-22 ENCOUNTER — Encounter: Payer: Self-pay | Admitting: Cardiology

## 2020-09-22 ENCOUNTER — Ambulatory Visit: Payer: BC Managed Care – PPO | Admitting: Cardiology

## 2020-09-22 VITALS — BP 134/82 | HR 75 | Ht 70.0 in | Wt 242.0 lb

## 2020-09-22 DIAGNOSIS — E7800 Pure hypercholesterolemia, unspecified: Secondary | ICD-10-CM

## 2020-09-22 DIAGNOSIS — I10 Essential (primary) hypertension: Secondary | ICD-10-CM | POA: Diagnosis not present

## 2020-09-22 DIAGNOSIS — R072 Precordial pain: Secondary | ICD-10-CM

## 2020-09-22 MED ORDER — METOPROLOL TARTRATE 100 MG PO TABS: 100.0000 mg | ORAL_TABLET | Freq: Once | ORAL | 0 refills | Status: DC

## 2020-09-22 NOTE — Progress Notes (Signed)
Cardiology Office Note:    Date:  09/22/2020   ID:  Jeffery Norman, DOB 01-04-1956, MRN [id number]  PCP:  Malva Limes, MD   [facility]  Cardiologist:  Debbe Odea, MD  Advanced Practice Provider:  No care team member to display Electrophysiologist:  None       Referring MD: Malva Limes, MD   Chief Complaint  Patient presents with  . New Patient (Initial Visit)    Referred by PCP for Chest pain and DOE  Pt c/o occasional chest tightness with occasional mild pain on left side towards the center. Also c/o pain in left arm and occasional DOE accompanied by elevated HR (120s).   Jeffery Norman is a 65 y.o. male who is being seen today for the evaluation of chest pain at the request of Fisher, Demetrios Isaacs, MD.   History of Present Illness:    Jeffery Norman is a 65 y.o. male with a hx of hypertension, hyperlipidemia, diabetes, former smoker x30 years who presents due to chest pain.  Patient has symptoms of chest discomfort associated with exertion ongoing over the past 6 weeks.  Symptoms initially presented 6 weeks ago while he was working in his yard gathering mulch.  Has also noticed symptoms of chest pain when walking over half a mile.  Symptoms sometimes are associated with left arm numbness and tingling.  States having chest discomfort 30 years ago, underwent stress testing and subsequent left heart cath showing no significant obstruction.  Symptoms were attributed to reflux.  Denies any family history of heart disease.  Past Medical History:  Diagnosis Date  . Carpal tunnel syndrome 11/10/2008  . Diabetes mellitus without complication (HCC)   . GERD (gastroesophageal reflux disease)   . Gout   . History of chicken pox   . History of measles   . History of mumps   . Hypertension     Past Surgical History:  Procedure Laterality Date  . APPENDECTOMY  1960's  . CARPAL TUNNEL RELEASE Left 10/02/2016   Procedure: CARPAL TUNNEL RELEASE;   Surgeon: Deeann Saint, MD;  Location: [facility] ORS;  Service: Orthopedics;  Laterality: Left;  . index finger surgery  1981    Current Medications: Current Meds  Medication Sig  . aspirin EC 81 MG tablet Take 2 tablets (162 mg total) by mouth daily. Swallow whole.  Marland Kitchen atorvastatin (LIPITOR) 40 MG tablet TAKE ONE TABLET BY MOUTH DAILY  . Cholecalciferol (VITAMIN D3) 25 MCG (1000 UT) CAPS Take 1 capsule by mouth daily.  . Coenzyme Q10 100 MG capsule Take 100 mg by mouth daily.  Marland Kitchen glucose blood test strip 1 strip daily.   Marland Kitchen lisinopril-hydrochlorothiazide (ZESTORETIC) 20-25 MG tablet TAKE 1/2 TABLET BY MOUTH DAILY. OFFICE VISIT NEEDED FOR ADDITIONAL REFILLS  . metFORMIN (GLUCOPHAGE-XR) 500 MG 24 hr tablet TAKE ONE TABLET BY MOUTH TWICE A DAY  . metoprolol tartrate (LOPRESSOR) 100 MG tablet Take 1 tablet (100 mg total) by mouth once for 1 dose. Take 2 hours prior to your CT scan.  . Omega-3 Fatty Acids (FISH OIL) 1000 MG CAPS Take 2 capsules by mouth daily.  Marland Kitchen omeprazole (PRILOSEC) 40 MG capsule Take 1 capsule (40 mg total) by mouth daily.  . Potassium 99 MG TABS Take 2 tablets by mouth daily.  . sildenafil (REVATIO) 20 MG tablet TAKE 3-5 TABLETS AS NEEDED PRIOR TO INTERCOURSE. NOT TO EXCEED 5 TABLETS IN A DAY.  . valACYclovir (VALTREX) 1000 MG tablet TAKE  ONE TABLET BY MOUTH EVERY 8 HOURS FOR UP TO 7 DAYS AS NEEDED     Allergies:   Naproxen   Social History   Socioeconomic History  . Marital status: Married    Spouse name: Not on file  . Number of children: 1  . Years of education: Not on file  . Highest education level: Not on file  Occupational History  . Occupation: Chartered certified accountant  Tobacco Use  . Smoking status: Former Smoker    Packs/day: 0.50    Years: 25.00    Pack years: 12.50    Types: E-cigarettes, Cigarettes    Quit date: 08/02/2011    Years since quitting: 9.1  . Smokeless tobacco: Never Used  Substance and Sexual Activity  . Alcohol use: Yes    Alcohol/week: 0.0 standard  drinks    Comment: moderate use  . Drug use: No  . Sexual activity: Not on file  Other Topics Concern  . Not on file  Social History Narrative  . Not on file   Social Determinants of Health   Financial Resource Strain: Not on file  Food Insecurity: Not on file  Transportation Needs: Not on file  Physical Activity: Not on file  Stress: Not on file  Social Connections: Not on file     Family History: The patient's family history includes Diabetes in his brother, mother, and another family member; Heart attack in his maternal grandmother; Hypertension in his mother.  ROS:   Please see the history of present illness.     All other systems reviewed and are negative.  EKGs/Labs/Other Studies Reviewed:    The following studies were reviewed today:   EKG:  EKG is  ordered today.  The ekg ordered today demonstrates normal sinus rhythm, normal ECG  Recent Labs: 10/20/2019: ALT 35; BUN 19; Creatinine, Ser 1.15; Hemoglobin 15.2; Platelets 200; Potassium 4.3; Sodium 137  Recent Lipid Panel    Component Value Date/Time   CHOL 176 10/20/2019 0910   TRIG 127 10/20/2019 0910   HDL 41 10/20/2019 0910   CHOLHDL 4.3 10/20/2019 0910   CHOLHDL 3.5 05/09/2017 0959   LDLCALC 112 (H) 10/20/2019 0910   LDLCALC 99 05/09/2017 0959     Risk Assessment/Calculations:      Physical Exam:    VS:  BP 134/82   Pulse 75   Ht 5\' 10"  (1.778 m)   Wt 242 lb (109.8 kg)   BMI 34.72 kg/m     Wt Readings from Last 3 Encounters:  09/22/20 242 lb (109.8 kg)  09/11/20 241 lb 6.4 oz (109.5 kg)  10/20/19 243 lb (110.2 kg)     GEN:  Well nourished, well developed in no acute distress HEENT: Normal NECK: No JVD; No carotid bruits LYMPHATICS: No lymphadenopathy CARDIAC: RRR, no murmurs, rubs, gallops RESPIRATORY:  Clear to auscultation without rales, wheezing or rhonchi  ABDOMEN: Soft, non-tender, non-distended MUSCULOSKELETAL:  No edema; No deformity  SKIN: Warm and dry NEUROLOGIC:  Alert and  oriented x 3 PSYCHIATRIC:  Normal affect   ASSESSMENT:    1. Precordial pain   2. Primary hypertension   3. Pure hypercholesterolemia    PLAN:    In order of problems listed above:  1. Patient with chest pain consistent with angina.  Has risk factors of hypertension, hyperlipidemia, diabetes, former smoker.  Obtain echocardiogram to evaluate systolic and diastolic function, wall motion abnormalities.  Get coronary CTA to evaluate presence of CAD. 2. Hypertension, BP controlled, continue HCTZ, lisinopril. 3. Hyperlipidemia, continue  Lipitor.  Follow-up after echo and coronary CTA.     Medication Adjustments/Labs and Tests Ordered: Current medicines are reviewed at length with the patient today.  Concerns regarding medicines are outlined above.  Orders Placed This Encounter  Procedures  . CT CORONARY MORPH W/CTA COR W/SCORE W/CA W/CM &/OR WO/CM  . CT CORONARY FRACTIONAL FLOW RESERVE DATA PREP  . CT CORONARY FRACTIONAL FLOW RESERVE FLUID ANALYSIS  . Basic metabolic panel  . EKG 12-Lead  . ECHOCARDIOGRAM COMPLETE   Meds ordered this encounter  Medications  . metoprolol tartrate (LOPRESSOR) 100 MG tablet    Sig: Take 1 tablet (100 mg total) by mouth once for 1 dose. Take 2 hours prior to your CT scan.    Dispense:  1 tablet    Refill:  0    Patient Instructions  Medication Instructions:   Your physician recommends that you continue on your current medications as directed. Please refer to the Current Medication list given to you today.   *If you need a refill on your cardiac medications before your next appointment, please call your pharmacy*   Lab Work:  BMP to be drawn today.  If you have labs (blood work) drawn today and your tests are completely normal, you will receive your results only by: Marland Kitchen MyChart Message (if you have MyChart) OR . A paper copy in the mail If you have any lab test that is abnormal or we need to change your treatment, we will call you to  review the results.   Testing/Procedures:  1.  Your physician has requested that you have an echocardiogram. Echocardiography is a painless test that uses sound waves to create images of your heart. It provides your doctor with information about the size and shape of your heart and how well your heart's chambers and valves are working. This procedure takes approximately one hour. There are no restrictions for this procedure.  2.  Your physician has requested that you have coronary cardiac CT. Cardiac computed tomography (CT) is a painless test that uses an x-ray machine to take clear, detailed pictures of your heart.   Your cardiac CT will be scheduled at:   [facility] [address] [place], [place] [address] [phone]  Please arrive 15 mins early for check-in and test prep.     Please follow these instructions carefully (unless otherwise directed):   Hold all sildenafil (REVATIO)  at least 3 days (72 hrs) prior to test.   On the Night Before the Test: . Be sure to Drink plenty of water. . Do not consume any caffeinated/decaffeinated beverages or chocolate 12 hours prior to your test. . Do not take any antihistamines 12 hours prior to your test.   On the Day of the Test: . Drink plenty of water until 1 hour prior to the test. . Do not eat any food 4 hours prior to the test. . You may take your regular medications prior to the test.  . Take metoprolol (Lopressor) two hours prior to test. (This was sent to your Pharmacy) . HOLD lisinopril-hydrochlorothiazide (ZESTORETIC)  morning of the test.  After the Test: . Drink plenty of water. . After receiving IV contrast, you may experience a mild flushed feeling. This is normal. . On occasion, you may experience a mild rash up to 24 hours after the test. This is not dangerous. If this occurs, you can take Benadryl 25 mg and increase your fluid intake. . If you experience  trouble  breathing, this can be serious. If it is severe call 911 IMMEDIATELY. If it is mild, please call our office. . If you take any of these medications: Glipizide/Metformin, Avandament, Glucavance, please do not take 48 hours after completing test unless otherwise instructed.   Once we have confirmed authorization from your insurance company, we will call you to set up a date and time for your test. Based on how quickly your insurance processes prior authorizations requests, please allow up to 4 weeks to be contacted for scheduling your Cardiac CT appointment. Be advised that routine Cardiac CT appointments could be scheduled as many as 8 weeks after your provider has ordered it.  For non-scheduling related questions, please contact the cardiac imaging nurse navigator should you have any questions/concerns: Rockwell AlexandriaSara Wallace, Cardiac Imaging Nurse Navigator Larey BrickMerle Prescott, Cardiac Imaging Nurse Navigator  Heart and Vascular Services Direct Office Dial: [phone]   For scheduling needs, including cancellations and rescheduling, please call [place], [phone].    Follow-Up: At [facility], you and your health needs are our priority.  As part of our continuing mission to provide you with exceptional heart care, we have created designated Provider Care Teams.  These Care Teams include your primary Cardiologist (physician) and Advanced Practice Providers (APPs -  Physician Assistants and Nurse Practitioners) who all work together to provide you with the care you need, when you need it.  We recommend signing up for the patient portal called "MyChart".  Sign up information is provided on this After Visit Summary.  MyChart is used to connect with patients for Virtual Visits (Telemedicine).  Patients are able to view lab/test results, encounter notes, upcoming appointments, etc.  Non-urgent messages can be sent to your provider as well.   To learn more about what you can do with MyChart, go to  [email or web address].    Your next appointment:   6 week(s)  The format for your next appointment:   In Person  Provider:   Debbe OdeaBrian Agbor-Etang, MD   Other Instructions      Signed, Debbe OdeaBrian Agbor-Etang, MD  09/22/2020 5:04 PM    [facility]

## 2020-09-22 NOTE — Patient Instructions (Signed)
Medication Instructions:   Your physician recommends that you continue on your current medications as directed. Please refer to the Current Medication list given to you today.   *If you need a refill on your cardiac medications before your next appointment, please call your pharmacy*   Lab Work:  BMP to be drawn today.  If you have labs (blood work) drawn today and your tests are completely normal, you will receive your results only by: Marland Kitchen MyChart Message (if you have MyChart) OR . A paper copy in the mail If you have any lab test that is abnormal or we need to change your treatment, we will call you to review the results.   Testing/Procedures:  1.  Your physician has requested that you have an echocardiogram. Echocardiography is a painless test that uses sound waves to create images of your heart. It provides your doctor with information about the size and shape of your heart and how well your heart's chambers and valves are working. This procedure takes approximately one hour. There are no restrictions for this procedure.  2.  Your physician has requested that you have coronary cardiac CT. Cardiac computed tomography (CT) is a painless test that uses an x-ray machine to take clear, detailed pictures of your heart.   Your cardiac CT will be scheduled at:   [facility] [address] [place], [place] [address] [phone]  Please arrive 15 mins early for check-in and test prep.     Please follow these instructions carefully (unless otherwise directed):   Hold all sildenafil (REVATIO)  at least 3 days (72 hrs) prior to test.   On the Night Before the Test: . Be sure to Drink plenty of water. . Do not consume any caffeinated/decaffeinated beverages or chocolate 12 hours prior to your test. . Do not take any antihistamines 12 hours prior to your test.   On the Day of the Test: . Drink plenty of water until 1 hour prior to the  test. . Do not eat any food 4 hours prior to the test. . You may take your regular medications prior to the test.  . Take metoprolol (Lopressor) two hours prior to test. (This was sent to your Pharmacy) . HOLD lisinopril-hydrochlorothiazide (ZESTORETIC)  morning of the test.  After the Test: . Drink plenty of water. . After receiving IV contrast, you may experience a mild flushed feeling. This is normal. . On occasion, you may experience a mild rash up to 24 hours after the test. This is not dangerous. If this occurs, you can take Benadryl 25 mg and increase your fluid intake. . If you experience trouble breathing, this can be serious. If it is severe call 911 IMMEDIATELY. If it is mild, please call our office. . If you take any of these medications: Glipizide/Metformin, Avandament, Glucavance, please do not take 48 hours after completing test unless otherwise instructed.   Once we have confirmed authorization from your insurance company, we will call you to set up a date and time for your test. Based on how quickly your insurance processes prior authorizations requests, please allow up to 4 weeks to be contacted for scheduling your Cardiac CT appointment. Be advised that routine Cardiac CT appointments could be scheduled as many as 8 weeks after your provider has ordered it.  For non-scheduling related questions, please contact the cardiac imaging nurse navigator should you have any questions/concerns: Rockwell Alexandria, Cardiac Imaging Nurse Navigator Larey Brick, Cardiac Imaging Nurse  Navigator [facility] Direct Office Dial: [phone]   For scheduling needs, including cancellations and rescheduling, please call [place], [phone].    Follow-Up: At [facility], you and your health needs are our priority.  As part of our continuing mission to provide you with exceptional heart care, we have created designated Provider Care Teams.  These Care Teams  include your primary Cardiologist (physician) and Advanced Practice Providers (APPs -  Physician Assistants and Nurse Practitioners) who all work together to provide you with the care you need, when you need it.  We recommend signing up for the patient portal called "MyChart".  Sign up information is provided on this After Visit Summary.  MyChart is used to connect with patients for Virtual Visits (Telemedicine).  Patients are able to view lab/test results, encounter notes, upcoming appointments, etc.  Non-urgent messages can be sent to your provider as well.   To learn more about what you can do with MyChart, go to [email or web address].    Your next appointment:   6 week(s)  The format for your next appointment:   In Person  Provider:   Debbe Odea, MD   Other Instructions

## 2020-09-23 LAB — BASIC METABOLIC PANEL WITH GFR
BUN/Creatinine Ratio: 16 (ref 10–24)
BUN: 16 mg/dL (ref 8–27)
CO2: 21 mmol/L (ref 20–29)
Calcium: 9.5 mg/dL (ref 8.6–10.2)
Chloride: 99 mmol/L (ref 96–106)
Creatinine, Ser: 1.02 mg/dL (ref 0.76–1.27)
Glucose: 92 mg/dL (ref 65–99)
Potassium: 3.9 mmol/L (ref 3.5–5.2)
Sodium: 139 mmol/L (ref 134–144)
eGFR: 83 mL/min/1.73

## 2020-10-01 ENCOUNTER — Other Ambulatory Visit: Payer: Self-pay | Admitting: Family Medicine

## 2020-10-01 DIAGNOSIS — I10 Essential (primary) hypertension: Secondary | ICD-10-CM

## 2020-10-01 NOTE — Telephone Encounter (Signed)
Requested medication (s) are due for refill today: yes  Requested medication (s) are on the active medication list: yes  Last refill:  30 day courtesy RF 08/11/20 #15   Future visit scheduled: yes 11/01/20  Notes to clinic:  courtesy RF already given please review   Requested Prescriptions  Pending Prescriptions Disp Refills   lisinopril-hydrochlorothiazide (ZESTORETIC) 20-25 MG tablet [Pharmacy Med Name: LISINOPRIL-HCTZ 20-25 MG TAB] 15 tablet 0    Sig: TAKE 1/2 TABLET BY MOUTH DAILY      Cardiovascular:  ACEI + Diuretic Combos Passed - 10/01/2020 11:31 AM      Passed - Na in normal range and within 180 days    Sodium  Date Value Ref Range Status  09/22/2020 139 134 - 144 mmol/L Final          Passed - K in normal range and within 180 days    Potassium  Date Value Ref Range Status  09/22/2020 3.9 3.5 - 5.2 mmol/L Final          Passed - Cr in normal range and within 180 days    Creat  Date Value Ref Range Status  05/09/2017 0.96 0.70 - 1.33 mg/dL Final    Comment:    For patients >49 years of age, the reference limit for Creatinine is approximately 13% higher for people identified as African-American. .    Creatinine, Ser  Date Value Ref Range Status  09/22/2020 1.02 0.76 - 1.27 mg/dL Final   Creatinine, POC  Date Value Ref Range Status  05/09/2017 n/a mg/dL Final          Passed - Ca in normal range and within 180 days    Calcium  Date Value Ref Range Status  09/22/2020 9.5 8.6 - 10.2 mg/dL Final          Passed - Patient is not pregnant      Passed - Last BP in normal range    BP Readings from Last 1 Encounters:  09/22/20 134/82          Passed - Valid encounter within last 6 months    Recent Outpatient Visits           2 weeks ago Type 2 diabetes mellitus with microalbuminuria, without long-term current use of insulin [facility])   [facility] Malva Limes, MD   11 months ago Annual physical exam   [facility]  Malva Limes, MD   1 year ago Need for shingles vaccine   [facility] Malva Limes, MD   1 year ago Essential (primary) hypertension   [facility] Malva Limes, MD   1 year ago Annual physical exam   [facility] Malva Limes, MD       Future Appointments             In 1 month Fisher, Demetrios Isaacs, MD [facility]   In 1 month Agbor-Etang, Arlys John, MD [id number]

## 2020-10-02 NOTE — Telephone Encounter (Signed)
L.O.V. was on 09/11/2020 and upcoming visit on 11/01/20. Medication refill was send into pharmacy.

## 2020-10-10 ENCOUNTER — Telehealth (HOSPITAL_COMMUNITY): Payer: Self-pay | Admitting: *Deleted

## 2020-10-10 NOTE — Telephone Encounter (Signed)
Reaching out to patient to offer assistance regarding upcoming cardiac imaging study; pt verbalizes understanding of appt date/time, parking situation and where to check in, pre-test NPO status and medications ordered, and verified current allergies; name and call back number provided for further questions should they arise  Shontae Rosiles RN Navigator Cardiac Imaging [place] [facility] [phone] office [phone] cell  Pt to take 100mg metoprolol tartrate 2 hours prior to cardiac CT scan. 

## 2020-10-12 ENCOUNTER — Ambulatory Visit
Admission: RE | Admit: 2020-10-12 | Discharge: 2020-10-12 | Disposition: A | Discharge status: Home / Self Care | Payer: BC Managed Care – PPO | Source: Ambulatory Visit | Attending: Cardiology | Admitting: Cardiology

## 2020-10-12 ENCOUNTER — Other Ambulatory Visit: Payer: Self-pay

## 2020-10-12 DIAGNOSIS — R072 Precordial pain: Secondary | ICD-10-CM | POA: Diagnosis not present

## 2020-10-12 DIAGNOSIS — I2510 Atherosclerotic heart disease of native coronary artery without angina pectoris: Secondary | ICD-10-CM | POA: Diagnosis not present

## 2020-10-12 MED ORDER — METOPROLOL TARTRATE 5 MG/5ML IV SOLN
10.0000 mg | Freq: Once | INTRAVENOUS | Status: AC
  Administered 2020-10-12: 10 mg via INTRAVENOUS

## 2020-10-12 MED ORDER — NITROGLYCERIN 0.4 MG SL SUBL
0.8000 mg | SUBLINGUAL_TABLET | Freq: Once | SUBLINGUAL | Status: AC
  Administered 2020-10-12: 0.8 mg via SUBLINGUAL

## 2020-10-12 MED ORDER — IOHEXOL 350 MG/ML SOLN
100.0000 mL | Freq: Once | INTRAVENOUS | Status: AC | PRN
  Administered 2020-10-12: 100 mL via INTRAVENOUS

## 2020-10-12 NOTE — Progress Notes (Signed)
Patient tolerated CT well. Drank water after. Vital signs stable encourage to drink water throughout day.Reasons explained and verbalized understanding. Ambulated steady gait.  

## 2020-10-16 ENCOUNTER — Telehealth: Payer: Self-pay

## 2020-10-16 DIAGNOSIS — E7800 Pure hypercholesterolemia, unspecified: Secondary | ICD-10-CM

## 2020-10-16 NOTE — Telephone Encounter (Signed)
Called and left a detailed VM for patient per DPR on file with the result note as seen below. Lipid panel has been ordered. Encouraged patient to call back if he had any further questions.

## 2020-10-16 NOTE — Telephone Encounter (Signed)
-----   Message from Debbe Odea, MD sent at 10/13/2020 11:18 AM EDT ----- Calcified coronary arteries, moderate stenosis in LAD.  Continue aspirin 81 mg daily, repeat fasting lipid profile.  Goal LDL less than 70.  Based on results of repeat lipid profile, may need to increase dose of statin.  We will try to do further analysis with FFR CT.  Sometimes it may be difficult due to heavily calcified arteries.  Keep follow-up appointment, for further explanation.

## 2020-10-20 ENCOUNTER — Other Ambulatory Visit: Payer: Self-pay

## 2020-10-20 ENCOUNTER — Other Ambulatory Visit
Admission: RE | Admit: 2020-10-20 | Discharge: 2020-10-20 | Disposition: A | Discharge status: Home / Self Care | Payer: BC Managed Care – PPO | Attending: Cardiology | Admitting: Cardiology

## 2020-10-20 DIAGNOSIS — E7800 Pure hypercholesterolemia, unspecified: Secondary | ICD-10-CM | POA: Diagnosis not present

## 2020-10-20 LAB — LIPID PANEL
Cholesterol: 184 mg/dL (ref 0–200)
HDL: 38 mg/dL — ABNORMAL LOW (ref >40)
LDL Cholesterol: 113 mg/dL — ABNORMAL HIGH (ref 0–99)
Total CHOL/HDL Ratio: 4.8 RATIO
Triglycerides: 163 mg/dL — ABNORMAL HIGH (ref <150)
VLDL: 33 mg/dL (ref 0–40)

## 2020-10-23 ENCOUNTER — Telehealth: Payer: Self-pay

## 2020-10-23 NOTE — Telephone Encounter (Signed)
-----   Message from Brian Agbor-Etang, MD sent at 10/20/2020 12:54 PM EDT ----- LDL/bad cholesterol elevated, due to significant calcifications, increase Lipitor to 80 mg daily.  Keep follow-up appointment 

## 2020-10-24 MED ORDER — ATORVASTATIN CALCIUM 80 MG PO TABS: 80.0000 mg | ORAL_TABLET | Freq: Every day | ORAL | 5 refills | Status: DC

## 2020-10-24 NOTE — Telephone Encounter (Signed)
-----   Message from Debbe Odea, MD sent at 10/20/2020 12:54 PM EDT ----- LDL/bad cholesterol elevated, due to significant calcifications, increase Lipitor to 80 mg daily.  Keep follow-up appointment

## 2020-10-24 NOTE — Telephone Encounter (Signed)
Called patient and gave him the result note as seen below. Prescription sent in. Patient verbalized understanding and agreed with plan.

## 2020-10-31 ENCOUNTER — Other Ambulatory Visit: Payer: Self-pay

## 2020-10-31 ENCOUNTER — Ambulatory Visit (INDEPENDENT_AMBULATORY_CARE_PROVIDER_SITE_OTHER): Payer: BC Managed Care – PPO

## 2020-10-31 DIAGNOSIS — R072 Precordial pain: Secondary | ICD-10-CM | POA: Diagnosis not present

## 2020-10-31 LAB — ECHOCARDIOGRAM COMPLETE
AR max vel: 3.84 cm2
AV Area VTI: 3.91 cm2
AV Area mean vel: 3.61 cm2
AV Mean grad: 3 mmHg
AV Peak grad: 5.6 mmHg
Ao pk vel: 1.18 m/s
Area-P 1/2: 2.97 cm2
Calc EF: 53.4 %
S' Lateral: 2.4 cm
Single Plane A2C EF: 54.3 %
Single Plane A4C EF: 52 %

## 2020-11-01 ENCOUNTER — Encounter: Payer: Self-pay | Admitting: Family Medicine

## 2020-11-01 ENCOUNTER — Ambulatory Visit (INDEPENDENT_AMBULATORY_CARE_PROVIDER_SITE_OTHER): Payer: BC Managed Care – PPO | Admitting: Family Medicine

## 2020-11-01 VITALS — BP 124/85 | HR 76 | Temp 97.0°F | Resp 16 | Ht 70.0 in | Wt 235.6 lb

## 2020-11-01 DIAGNOSIS — E1129 Type 2 diabetes mellitus with other diabetic kidney complication: Secondary | ICD-10-CM

## 2020-11-01 DIAGNOSIS — Z0000 Encounter for general adult medical examination without abnormal findings: Secondary | ICD-10-CM | POA: Diagnosis not present

## 2020-11-01 DIAGNOSIS — E7800 Pure hypercholesterolemia, unspecified: Secondary | ICD-10-CM

## 2020-11-01 DIAGNOSIS — I10 Essential (primary) hypertension: Secondary | ICD-10-CM

## 2020-11-01 DIAGNOSIS — R809 Proteinuria, unspecified: Secondary | ICD-10-CM

## 2020-11-01 DIAGNOSIS — Z125 Encounter for screening for malignant neoplasm of prostate: Secondary | ICD-10-CM

## 2020-11-01 NOTE — Progress Notes (Signed)
Complete physical exam   Patient: Jeffery Norman   DOB: 10/20/1955   65 y.o. Male  MRN: [id number] Visit Date: 11/01/2020  Today's healthcare provider: Mila Merry, MD   Chief Complaint  Patient presents with  . Annual Exam   Subjective    Jeffery Norman is a 65 y.o. male who presents today for a complete physical exam.  He reports consuming a general diet. Home exercise routine includes walking 2 times a week. He generally feels well. He reports sleeping fairly well. He does not have additional problems to discuss today.  HPI  Hypertension, follow-up  BP Readings from Last 3 Encounters:  11/01/20 124/85  10/12/20 131/81  09/22/20 134/82   Wt Readings from Last 3 Encounters:  11/01/20 235 lb 9.6 oz (106.9 kg)  09/22/20 242 lb (109.8 kg)  09/11/20 241 lb 6.4 oz (109.5 kg)      He reports good compliance with treatment. He is not having side effects.  He is following a Regular diet. He is exercising. He does not smoke.  Use of agents associated with hypertension: NSAIDS.   Outside blood pressures are 138/89. Symptoms: No chest pain No chest pressure  No palpitations No syncope  No dyspnea No orthopnea  No paroxysmal nocturnal dyspnea No lower extremity edema   Pertinent labs: Lab Results  Component Value Date   CHOL 184 10/20/2020   HDL 38 (L) 10/20/2020   LDLCALC 113 (H) 10/20/2020   TRIG 163 (H) 10/20/2020   CHOLHDL 4.8 10/20/2020   Lab Results  Component Value Date   NA 139 09/22/2020   K 3.9 09/22/2020   CREATININE 1.02 09/22/2020   GFRNONAA 68 10/20/2019   GFRAA 79 10/20/2019   GLUCOSE 92 09/22/2020     The 10-year ASCVD risk score Denman George DC Jr., et al., 2013) is: 22.7%   ---------------------------------------------------------------------------------------------------  Past Medical History:  Diagnosis Date  . Carpal tunnel syndrome 11/10/2008  . Diabetes mellitus without complication (HCC)   . GERD (gastroesophageal reflux disease)    . Gout   . History of chicken pox   . History of measles   . History of mumps   . Hypertension    Past Surgical History:  Procedure Laterality Date  . APPENDECTOMY  1960's  . CARPAL TUNNEL RELEASE Left 10/02/2016   Procedure: CARPAL TUNNEL RELEASE;  Surgeon: Deeann Saint, MD;  Location: [facility] ORS;  Service: Orthopedics;  Laterality: Left;  . index finger surgery  1981   Social History   Socioeconomic History  . Marital status: Married    Spouse name: Not on file  . Number of children: 1  . Years of education: Not on file  . Highest education level: Not on file  Occupational History  . Occupation: Chartered certified accountant  Tobacco Use  . Smoking status: Former Smoker    Packs/day: 0.50    Years: 25.00    Pack years: 12.50    Types: E-cigarettes, Cigarettes    Quit date: 08/02/2011    Years since quitting: 9.2  . Smokeless tobacco: Never Used  Substance and Sexual Activity  . Alcohol use: Yes    Alcohol/week: 0.0 standard drinks    Comment: moderate use  . Drug use: No  . Sexual activity: Not on file  Other Topics Concern  . Not on file  Social History Narrative  . Not on file   Social Determinants of Health   Financial Resource Strain: Not on file  Food Insecurity: Not on file  Transportation Needs: Not on file  Physical Activity: Not on file  Stress: Not on file  Social Connections: Not on file  Intimate Partner Violence: Not on file   Family Status  Relation Name Status  . Mother  Deceased at age 63       septic shock  . Father  Deceased at age 63       accidental  . Brother  Alive  . Son  Alive  . MGM  Deceased at age 63's  . Other  (Not Specified)   Family History  Problem Relation Age of Onset  . Diabetes Mother   . Hypertension Mother   . Diabetes Brother   . Heart attack Maternal Grandmother   . Diabetes Other    Allergies  Allergen Reactions  . Naproxen Anaphylaxis    Onset 30minute after taken one naproxen on 07-01-17, although had taken naproxen many  times before and had been on daily ECASA    Patient Care Team: Malva LimesFisher, Jaquisha Frech E, MD as PCP - General (Family Medicine) Debbe OdeaAgbor-Etang, Brian, MD as PCP - Cardiology (Cardiology)   Medications: Outpatient Medications Prior to Visit  Medication Sig  . aspirin EC 81 MG tablet Take 2 tablets (162 mg total) by mouth daily. Swallow whole.  Marland Kitchen. atorvastatin (LIPITOR) 80 MG tablet Take 1 tablet (80 mg total) by mouth daily.  . Cholecalciferol (VITAMIN D3) 25 MCG (1000 UT) CAPS Take 1 capsule by mouth daily.  . Coenzyme Q10 100 MG capsule Take 100 mg by mouth daily.  Marland Kitchen. glucose blood test strip 1 strip daily.   Marland Kitchen. lisinopril-hydrochlorothiazide (ZESTORETIC) 20-25 MG tablet TAKE 1/2 TABLET BY MOUTH DAILY  . metFORMIN (GLUCOPHAGE-XR) 500 MG 24 hr tablet TAKE ONE TABLET BY MOUTH TWICE A DAY  . Omega-3 Fatty Acids (FISH OIL) 1000 MG CAPS Take 2 capsules by mouth daily.  Marland Kitchen. omeprazole (PRILOSEC) 40 MG capsule Take 1 capsule (40 mg total) by mouth daily.  . Potassium 99 MG TABS Take 2 tablets by mouth daily.  . sildenafil (REVATIO) 20 MG tablet TAKE 3-5 TABLETS AS NEEDED PRIOR TO INTERCOURSE. NOT TO EXCEED 5 TABLETS IN A DAY.  . valACYclovir (VALTREX) 1000 MG tablet TAKE ONE TABLET BY MOUTH EVERY 8 HOURS FOR UP TO 7 DAYS AS NEEDED  . [DISCONTINUED] atorvastatin (LIPITOR) 40 MG tablet TAKE ONE TABLET BY MOUTH DAILY  . [DISCONTINUED] metoprolol tartrate (LOPRESSOR) 100 MG tablet Take 1 tablet (100 mg total) by mouth once for 1 dose. Take 2 hours prior to your CT scan.   No facility-administered medications prior to visit.    Review of Systems  Constitutional: Negative for appetite change, chills, fatigue and fever.  HENT: Negative for congestion, ear pain, hearing loss, nosebleeds and trouble swallowing.   Eyes: Negative for pain and visual disturbance.  Respiratory: Negative for cough, chest tightness and shortness of breath.   Cardiovascular: Negative for chest pain, palpitations and leg swelling.   Gastrointestinal: Negative for abdominal pain, blood in stool, constipation, diarrhea, nausea and vomiting.  Endocrine: Negative for polydipsia, polyphagia and polyuria.  Genitourinary: Negative for dysuria and flank pain.  Musculoskeletal: Negative for arthralgias, back pain, joint swelling, myalgias and neck stiffness.  Skin: Negative for color change, rash and wound.  Neurological: Negative for dizziness, tremors, seizures, speech difficulty, weakness, light-headedness and headaches.  Psychiatric/Behavioral: Negative for behavioral problems, confusion, decreased concentration, dysphoric mood and sleep disturbance. The patient is not nervous/anxious.   All other systems reviewed and are negative.  Objective    BP 124/85 (BP Location: Left Arm, Patient Position: Sitting, Cuff Size: Large)   Pulse 76   Temp (!) 97 F (36.1 C) (Temporal)   Resp 16   Ht 5\' 10"  (1.778 m)   Wt 235 lb 9.6 oz (106.9 kg)   BMI 33.81 kg/m    Physical Exam   General Appearance:    Mildly obese male. Alert, cooperative, in no acute distress, appears stated age  Head:    Normocephalic, without obvious abnormality, atraumatic  Eyes:    PERRL, conjunctiva/corneas clear, EOM's intact, fundi    benign, both eyes       Ears:    Normal TM's and external ear canals, both ears  Neck:   Supple, symmetrical, trachea midline, no adenopathy;       thyroid:  No enlargement/tenderness/nodules; no carotid   bruit or JVD  Back:     Symmetric, no curvature, ROM normal, no CVA tenderness  Lungs:     Clear to auscultation bilaterally, respirations unlabored  Chest wall:    No tenderness or deformity  Heart:    Normal heart rate. Normal rhythm. No murmurs, rubs, or gallops.  S1 and S2 normal  Abdomen:     Soft, non-tender, bowel sounds active all four quadrants,    no masses, no organomegaly  Genitalia:    deferred  Rectal:    deferred  Extremities:   All extremities are intact. No cyanosis or edema  Pulses:   2+  and symmetric all extremities  Skin:   Skin color, texture, turgor normal, no rashes or lesions  Lymph nodes:   Cervical, supraclavicular, and axillary nodes normal  Neurologic:   CNII-XII intact. Normal strength, sensation and reflexes      throughout     Last depression screening scores PHQ 2/9 Scores 11/01/2020 09/11/2020 10/20/2019  PHQ - 2 Score 0 0 0  PHQ- 9 Score 0 3 -   Last fall risk screening Fall Risk  11/01/2020  Falls in the past year? 0  Number falls in past yr: 0  Injury with Fall? 0  Follow up Falls evaluation completed   Last Audit-C alcohol use screening Alcohol Use Disorder Test (AUDIT) 11/01/2020  1. How often do you have a drink containing alcohol? 3  2. How many drinks containing alcohol do you have on a typical day when you are drinking? 1  3. How often do you have six or more drinks on one occasion? 0  AUDIT-C Score 4  4. How often during the last year have you found that you were not able to stop drinking once you had started? 0  5. How often during the last year have you failed to do what was normally expected from you because of drinking? 0  6. How often during the last year have you needed a first drink in the morning to get yourself going after a heavy drinking session? 0  7. How often during the last year have you had a feeling of guilt of remorse after drinking? 0  8. How often during the last year have you been unable to remember what happened the night before because you had been drinking? 0  9. Have you or someone else been injured as a result of your drinking? 0  10. Has a relative or friend or a doctor or another health worker been concerned about your drinking or suggested you cut down? 0  Alcohol Use Disorder Identification Test Final Score (AUDIT) 4  Alcohol Brief Interventions/Follow-up -   A score of 3 or more in women, and 4 or more in men indicates increased risk for alcohol abuse, EXCEPT if all of the points are from question 1   No results  found for any visits on 11/01/20.  Assessment & Plan    Routine Health Maintenance and Physical Exam  Exercise Activities and Dietary recommendations Goals   None     Immunization History  Administered Date(s) Administered  . Influenza,inj,Quad PF,6+ Mos 03/26/2019  . Influenza-Unspecified 04/03/2017  . Pneumococcal Polysaccharide-23 05/09/2017  . Td 03/28/1997  . Tdap 10/22/2007, 10/16/2018  . Zoster Recombinat (Shingrix) 10/16/2018, 03/26/2019    Health Maintenance  Topic Date Due  . FOOT EXAM  Never done  . COVID-19 Vaccine (1) Never done  . INFLUENZA VACCINE  01/01/2021  . OPHTHALMOLOGY EXAM  02/18/2021  . HEMOGLOBIN A1C  03/13/2021  . COLONOSCOPY (Pts 45-39yrs Insurance coverage will need to be confirmed)  05/14/2022  . TETANUS/TDAP  10/15/2028  . PNEUMOCOCCAL POLYSACCHARIDE VACCINE AGE 79-64 HIGH RISK  Completed  . Hepatitis C Screening  Completed  . HIV Screening  Completed  . Zoster Vaccines- Shingrix  Completed  . HPV VACCINES  Aged Out    Discussed health benefits of physical activity, and encouraged him to engage in regular exercise appropriate for his age and condition.  1. Annual physical exam   2. Type 2 diabetes mellitus with microalbuminuria, without long-term current use of insulin (HCC) Well controlled.  Hemoglobin A1c due in August. Given printed lab order to have drawn then.   3. Mobesity  Work on diet and exercise.   4. Essential (primary) hypertension Well controlled.  Continue current medications.    5. Pure hypercholesterolemia Tolerating recent increase of atorvastatin to 80mg . His cardiologist will be checking lipids in about 3 months.   6. Prostate cancer screening  - PSA Total (Reflex To Free) (he will have done with next blood draw in 2-3 months)       The entirety of the information documented in the History of Present Illness, Review of Systems and Physical Exam were personally obtained by me. Portions of this information were  initially documented by the CMA and reviewed by me for thoroughness and accuracy.      , MD  [facility] [phone] (phone) [phone] (fax)  [facility]

## 2020-11-01 NOTE — Patient Instructions (Signed)
.   Please review the attached list of medications and notify my office if there are any errors.   Jeffery Norman can take your lab order to the [facility] lab or the [facility] lab anytime after August 1st, 2022

## 2020-11-10 ENCOUNTER — Encounter: Payer: Self-pay | Admitting: Cardiology

## 2020-11-10 ENCOUNTER — Other Ambulatory Visit: Payer: Self-pay

## 2020-11-10 ENCOUNTER — Ambulatory Visit: Payer: BC Managed Care – PPO | Admitting: Cardiology

## 2020-11-10 VITALS — BP 131/83 | HR 78 | Ht 70.0 in | Wt 235.0 lb

## 2020-11-10 DIAGNOSIS — I10 Essential (primary) hypertension: Secondary | ICD-10-CM | POA: Diagnosis not present

## 2020-11-10 DIAGNOSIS — E7800 Pure hypercholesterolemia, unspecified: Secondary | ICD-10-CM | POA: Diagnosis not present

## 2020-11-10 DIAGNOSIS — I2510 Atherosclerotic heart disease of native coronary artery without angina pectoris: Secondary | ICD-10-CM | POA: Diagnosis not present

## 2020-11-10 NOTE — Patient Instructions (Signed)

## 2020-11-10 NOTE — Progress Notes (Signed)
Cardiology Office Note:    Date:  11/10/2020   ID:  Jeffery Norman, DOB 1956/04/16, MRN [id number]  PCP:  Malva Limes, MD   [facility]  Cardiologist:  Debbe Odea, MD  Advanced Practice Provider:  No care team member to display Electrophysiologist:  None       Referring MD: Malva Limes, MD   Chief Complaint  Patient presents with   Other    Follow up post Testing. Meds reviewed verbally with patient.     History of Present Illness:    Jeffery Norman is a 65 y.o. male with a hx of hypertension, hyperlipidemia, diabetes, former smoker x30 years who presents for follow-up.  Previously seen due to chest pain associated with some exertion and numbness in the left arm.  Due to risk factors, echocardiogram and coronary CTA was ordered.  He now presents for results.    Calcium score was noted to be elevated, he was encouraged to start aspirin.  He states chest discomfort and arm numbness has improved since starting aspirin.  Lipitor increased to 80 mg daily.  He presents for results.  Feels well.  Prior notes Echo 10/2018 showed normal systolic function, EF 55 to 60%, impaired relaxation. Coronary CTA on 10/12/2020 calcium score 1767, moderate LAD stenosis, mild RCA and left circumflex disease.  FFR not obtained due to significant artifact.  Past Medical History:  Diagnosis Date   Carpal tunnel syndrome 11/10/2008   Diabetes mellitus without complication (HCC)    GERD (gastroesophageal reflux disease)    Gout    History of chicken pox    History of measles    History of mumps    Hypertension     Past Surgical History:  Procedure Laterality Date   APPENDECTOMY  1960's   CARPAL TUNNEL RELEASE Left 10/02/2016   Procedure: CARPAL TUNNEL RELEASE;  Surgeon: Deeann Saint, MD;  Location: [facility] ORS;  Service: Orthopedics;  Laterality: Left;   index finger surgery  1981    Current Medications: Current Meds  Medication Sig   aspirin EC 81 MG  tablet Take 2 tablets (162 mg total) by mouth daily. Swallow whole.   atorvastatin (LIPITOR) 80 MG tablet Take 1 tablet (80 mg total) by mouth daily.   Cholecalciferol (VITAMIN D3) 25 MCG (1000 UT) CAPS Take 1 capsule by mouth daily.   Coenzyme Q10 100 MG capsule Take 100 mg by mouth daily.   glucose blood test strip 1 strip daily.    lisinopril-hydrochlorothiazide (ZESTORETIC) 20-25 MG tablet TAKE 1/2 TABLET BY MOUTH DAILY   metFORMIN (GLUCOPHAGE-XR) 500 MG 24 hr tablet TAKE ONE TABLET BY MOUTH TWICE A DAY   Omega-3 Fatty Acids (FISH OIL) 1000 MG CAPS Take 2 capsules by mouth daily.   omeprazole (PRILOSEC) 40 MG capsule Take 1 capsule (40 mg total) by mouth daily.   Potassium 99 MG TABS Take 2 tablets by mouth daily.   sildenafil (REVATIO) 20 MG tablet TAKE 3-5 TABLETS AS NEEDED PRIOR TO INTERCOURSE. NOT TO EXCEED 5 TABLETS IN A DAY.   valACYclovir (VALTREX) 1000 MG tablet TAKE ONE TABLET BY MOUTH EVERY 8 HOURS FOR UP TO 7 DAYS AS NEEDED     Allergies:   Naproxen   Social History   Socioeconomic History   Marital status: Married    Spouse name: Not on file   Number of children: 1   Years of education: Not on file   Highest education level: Not on file  Occupational History   Occupation: Chartered certified accountant  Tobacco Use   Smoking status: Former    Packs/day: 0.50    Years: 25.00    Pack years: 12.50    Types: E-cigarettes, Cigarettes    Quit date: 08/02/2011    Years since quitting: 9.2   Smokeless tobacco: Never  Substance and Sexual Activity   Alcohol use: Yes    Alcohol/week: 0.0 standard drinks    Comment: moderate use   Drug use: No   Sexual activity: Not on file  Other Topics Concern   Not on file  Social History Narrative   Not on file   Social Determinants of Health   Financial Resource Strain: Not on file  Food Insecurity: Not on file  Transportation Needs: Not on file  Physical Activity: Not on file  Stress: Not on file  Social Connections: Not on file     Family  History: The patient's family history includes Diabetes in his brother, mother, and another family member; Heart attack in his maternal grandmother; Hypertension in his mother.  ROS:   Please see the history of present illness.     All other systems reviewed and are negative.  EKGs/Labs/Other Studies Reviewed:    The following studies were reviewed today:   EKG:  EKG not ordered today.    Recent Labs: 09/22/2020: BUN 16; Creatinine, Ser 1.02; Potassium 3.9; Sodium 139  Recent Lipid Panel    Component Value Date/Time   CHOL 184 10/20/2020 0708   CHOL 176 10/20/2019 0910   TRIG 163 (H) 10/20/2020 0708   HDL 38 (L) 10/20/2020 0708   HDL 41 10/20/2019 0910   CHOLHDL 4.8 10/20/2020 0708   VLDL 33 10/20/2020 0708   LDLCALC 113 (H) 10/20/2020 0708   LDLCALC 112 (H) 10/20/2019 0910   LDLCALC 99 05/09/2017 0959     Risk Assessment/Calculations:      Physical Exam:    VS:  BP 131/83 (BP Location: Left Arm, Patient Position: Sitting, Cuff Size: Large)   Pulse 78   Ht 5\' 10"  (1.778 m)   Wt 235 lb (106.6 kg)   SpO2 97%   BMI 33.72 kg/m     Wt Readings from Last 3 Encounters:  11/10/20 235 lb (106.6 kg)  11/01/20 235 lb 9.6 oz (106.9 kg)  09/22/20 242 lb (109.8 kg)     GEN:  Well nourished, well developed in no acute distress HEENT: Normal NECK: No JVD; No carotid bruits LYMPHATICS: No lymphadenopathy CARDIAC: RRR, no murmurs, rubs, gallops RESPIRATORY:  Clear to auscultation without rales, wheezing or rhonchi  ABDOMEN: Soft, non-tender, non-distended MUSCULOSKELETAL:  No edema; No deformity  SKIN: Warm and dry NEUROLOGIC:  Alert and oriented x 3 PSYCHIATRIC:  Normal affect   ASSESSMENT:    1. Coronary artery disease involving native coronary artery of native heart, unspecified whether angina present   2. Primary hypertension   3. Pure hypercholesterolemia     PLAN:    In order of problems listed above:  CAD, chest pain resolved since starting aspirin. echo  showed normal systolic function, EF 55 to 60%, impaired relaxation.  Coronary CTA with calcium score 1767.  Moderate proximal LAD stenosis, mild RCA and left circumflex stenosis.  FFR CT not performed due to artifact on coronary CTA.  Currently asymptomatic.  If he becomes symptomatic, will plan left heart cath.  Patient is high risk for coronary event. Hypertension, BP controlled, continue HCTZ, lisinopril. Hyperlipidemia, continue Lipitor 80.  Follow-up in 6 months.  Medication Adjustments/Labs and Tests Ordered: Current medicines are reviewed at length with the patient today.  Concerns regarding medicines are outlined above.  No orders of the defined types were placed in this encounter.  No orders of the defined types were placed in this encounter.   Patient Instructions  Medication Instructions:  Your physician recommends that you continue on your current medications as directed. Please refer to the Current Medication list given to you today.  *If you need a refill on your cardiac medications before your next appointment, please call your pharmacy*   Lab Work: None ordered If you have labs (blood work) drawn today and your tests are completely normal, you will receive your results only by: MyChart Message (if you have MyChart) OR A paper copy in the mail If you have any lab test that is abnormal or we need to change your treatment, we will call you to review the results.   Testing/Procedures: None ordered   Follow-Up: At [facility], you and your health needs are our priority.  As part of our continuing mission to provide you with exceptional heart care, we have created designated Provider Care Teams.  These Care Teams include your primary Cardiologist (physician) and Advanced Practice Providers (APPs -  Physician Assistants and Nurse Practitioners) who all work together to provide you with the care you need, when you need it.  We recommend signing up for the patient  portal called "MyChart".  Sign up information is provided on this After Visit Summary.  MyChart is used to connect with patients for Virtual Visits (Telemedicine).  Patients are able to view lab/test results, encounter notes, upcoming appointments, etc.  Non-urgent messages can be sent to your provider as well.   To learn more about what you can do with MyChart, go to [email or web address].    Your next appointment:   6 month(s)  The format for your next appointment:   In Person  Provider:   Debbe Odea, MD   Other Instructions    Signed, Debbe Odea, MD  11/10/2020 3:18 PM    [facility]

## 2020-11-14 ENCOUNTER — Other Ambulatory Visit: Payer: Self-pay | Admitting: Family Medicine

## 2021-02-16 ENCOUNTER — Other Ambulatory Visit: Payer: Self-pay | Admitting: Family Medicine

## 2021-03-22 ENCOUNTER — Other Ambulatory Visit: Payer: Self-pay | Admitting: Family Medicine

## 2021-03-22 DIAGNOSIS — I10 Essential (primary) hypertension: Secondary | ICD-10-CM

## 2021-04-20 ENCOUNTER — Other Ambulatory Visit: Payer: Self-pay | Admitting: *Deleted

## 2021-04-20 MED ORDER — ATORVASTATIN CALCIUM 80 MG PO TABS: 80.0000 mg | ORAL_TABLET | Freq: Every day | ORAL | 0 refills | Status: DC

## 2021-05-16 ENCOUNTER — Telehealth: Payer: Self-pay | Admitting: Family Medicine

## 2021-05-16 MED ORDER — METFORMIN HCL ER 500 MG PO TB24: 500.0000 mg | ORAL_TABLET | Freq: Two times a day (BID) | ORAL | 0 refills | Status: DC

## 2021-05-16 NOTE — Telephone Encounter (Signed)
[facility] faxed refill request for the following medications:  metFORMIN (GLUCOPHAGE-XR) 500 MG 24 hr tablet   Please advise.

## 2021-05-29 ENCOUNTER — Other Ambulatory Visit: Payer: Self-pay

## 2021-05-29 MED ORDER — ATORVASTATIN CALCIUM 80 MG PO TABS: 80.0000 mg | ORAL_TABLET | Freq: Every day | ORAL | 0 refills | Status: DC

## 2021-06-15 ENCOUNTER — Other Ambulatory Visit: Payer: Self-pay | Admitting: Family Medicine

## 2021-06-15 ENCOUNTER — Ambulatory Visit: Payer: BC Managed Care – PPO | Admitting: Cardiology

## 2021-06-15 DIAGNOSIS — B09 Unspecified viral infection characterized by skin and mucous membrane lesions: Secondary | ICD-10-CM

## 2021-06-15 NOTE — Telephone Encounter (Signed)
Requested Prescriptions  Pending Prescriptions Disp Refills   valACYclovir (VALTREX) 1000 MG tablet [Pharmacy Med Name: VALACYCLOVIR HCL 1 GRAM TABLET] 42 tablet 1    Sig: TAKE ONE TABLET BY MOUTH EVERY 8 HOURS FOR UP TO 7 DAYS AS NEEDED     Antimicrobials:  Antiviral Agents - Anti-Herpetic Passed - 06/15/2021  6:01 AM      Passed - Valid encounter within last 12 months    Recent Outpatient Visits          7 months ago Annual physical exam   [facility] Malva Limes, MD   9 months ago Type 2 diabetes mellitus with microalbuminuria, without long-term current use of insulin [facility])   [facility] Malva Limes, MD   1 year ago Annual physical exam   [facility] Malva Limes, MD   2 years ago Need for shingles vaccine   [facility] Malva Limes, MD   2 years ago Essential (primary) hypertension   [facility] Sherrie Mustache, Demetrios Isaacs, MD

## 2021-06-22 ENCOUNTER — Other Ambulatory Visit: Payer: Self-pay | Admitting: Family Medicine

## 2021-06-22 DIAGNOSIS — I10 Essential (primary) hypertension: Secondary | ICD-10-CM

## 2021-06-22 MED ORDER — LISINOPRIL-HYDROCHLOROTHIAZIDE 10-12.5 MG PO TABS: 1.0000 | ORAL_TABLET | Freq: Every day | ORAL | 0 refills | Status: DC

## 2021-07-04 ENCOUNTER — Other Ambulatory Visit: Payer: Self-pay

## 2021-07-04 MED ORDER — ATORVASTATIN CALCIUM 80 MG PO TABS: 80.0000 mg | ORAL_TABLET | Freq: Every day | ORAL | 0 refills | Status: DC

## 2021-07-09 IMAGING — CT CT HEART MORP W/ CTA COR W/ SCORE W/ CA W/CM &/OR W/O CM
1 of 24 series · 1 of 24 positions shown, 2 images · non-contrast
Comparison: None.

Addendum:
CLINICAL DATA: Chest pain

EXAM:
Cardiac/Coronary  CTA
TECHNIQUE: The patient was scanned on a Siemens Somatom go.Top scanner.

[Series 4: sa36 calcium scoring 3.00 · axial · 0.38mm/px · z∈[-1139,-1139]mm · 1 of 46 slices shown, 2 images]
[im 1/46  vessel]
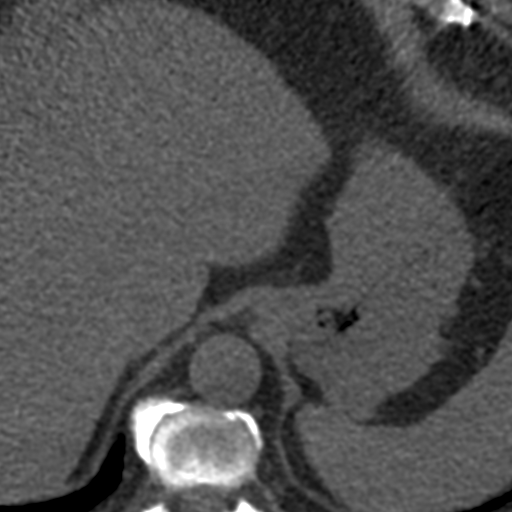
[im 1/46  lung]
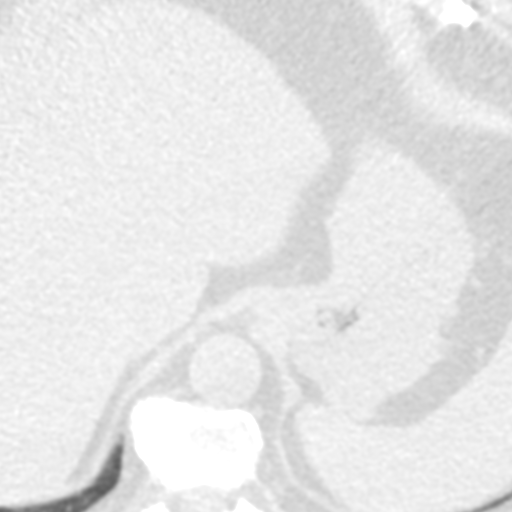

[1 of 24 positions shown; findings below may reference images not displayed]



Aortic Valve:  Trileaflet.  No calcifications.

Coronary Arteries:  Normal coronary origin.  Right dominance.

RCA is a large artery that gives rise to PDA and PLA. It is heavily
calcified throughout its cause causing mild proximal and mid vessel
stenosis (25-49%).

Left main is a large artery that gives rise to LAD and LCX arteries.

LAD has calcified plaque in the proximal segment causing moderate
(50%).

LCX is a non-dominant artery that gives rise to one OM1 branch.
There is calcified plaque in the mid LCx causing at least mild
(25-49%). Image quality degraded by calcium/blooming artifacts.

Other findings:

Normal pulmonary vein drainage into the left atrium.

Normal left atrial appendage without a thrombus.

Normal size of the pulmonary artery.
IMPRESSION: 1. Coronary calcium score of 7343. This was 98th percentile for age
and sex matched control.

2. Normal coronary origin with right dominance.

3. Calcified plaque causing moderate stenosis in the proximal LAD.

4. Mild stenosis in the RCA and LCx.

5. CAD-RADS 3. Moderate stenosis. Consider symptom-guided
anti-ischemic pharmacotherapy as well as risk factor modification
per guideline directed care.

Additional analysis with CT FFR will be submitted and reported
separately.

EXAM:
OVER-READ INTERPRETATION  CT CHEST

The following report is an over-read performed by radiologist Dr.
Terrell Cristina [REDACTED] on 10/12/2020. This over-read
does not include interpretation of cardiac or coronary anatomy or
pathology. The coronary CTA interpretation by the cardiologist is
attached.
FINDINGS: Heart is normal size. Calcifications in the aortic root.
Atherosclerotic irregularity in the descending thoracic aorta.
Ascending thoracic aorta upper limits of normal at 3.9 cm at the
aortic root/sinuses of Valsalva. No adenopathy. No confluent
airspace opacities or effusions. 5 mm nodule along the minor fissure
on the right on image 15. 2 mm right upper lobe nodule on image 7.
Imaging into the upper abdomen demonstrates no acute findings. Chest
wall soft tissues are unremarkable. No acute bony abnormality.
IMPRESSION: Aortic atherosclerosis. Aortic root upper limits normal in size at
3.9 cm.

Small right lung pulmonary nodules, the largest 5 mm. No follow-up
needed if patient is low-risk (and has no known or suspected primary
neoplasm). Non-contrast chest CT can be considered in 12 months if
patient is high-risk. This recommendation follows the consensus
statement: Guidelines for Management of Incidental Pulmonary Nodules
Detected on CT Images: From the [HOSPITAL] 5802; Radiology
5802; [DATE].



Aortic Valve:  Trileaflet.  No calcifications.

Coronary Arteries:  Normal coronary origin.  Right dominance.

RCA is a large artery that gives rise to PDA and PLA. It is heavily
calcified throughout its cause causing mild proximal and mid vessel
stenosis (25-49%).

Left main is a large artery that gives rise to LAD and LCX arteries.

LAD has calcified plaque in the proximal segment causing moderate
(50%).

LCX is a non-dominant artery that gives rise to one OM1 branch.
There is calcified plaque in the mid LCx causing at least mild
(25-49%). Image quality degraded by calcium/blooming artifacts.

Other findings:

Normal pulmonary vein drainage into the left atrium.

Normal left atrial appendage without a thrombus.

Normal size of the pulmonary artery.
IMPRESSION: 1. Coronary calcium score of 7343. This was 98th percentile for age
and sex matched control.

2. Normal coronary origin with right dominance.

3. Calcified plaque causing moderate stenosis in the proximal LAD.

4. Mild stenosis in the RCA and LCx.

5. CAD-RADS 3. Moderate stenosis. Consider symptom-guided
anti-ischemic pharmacotherapy as well as risk factor modification
per guideline directed care.

Additional analysis with CT FFR will be submitted and reported
separately.

## 2021-07-16 ENCOUNTER — Other Ambulatory Visit: Payer: Self-pay | Admitting: Family Medicine

## 2021-07-16 NOTE — Telephone Encounter (Signed)
[facility] faxed refill request for the following medications:   metFORMIN (GLUCOPHAGE-XR) 500 MG 24 hr tablet  Please advise

## 2021-07-16 NOTE — Telephone Encounter (Signed)
Please advise. Patient was last seen in the office on 11/01/2020 for a CPE. At that visit lab slip was printed and given to patient to have blood work completed in 01/2021. Patient did not have labs done. His last A1C was 09/11/2020 and the result was 6.2. Patient has a CPE appointment scheduled for 11/07/2021. Please advise on refill request and whether patient needs to schedule a DM follow up appointment to check A1C .

## 2021-07-17 MED ORDER — METFORMIN HCL ER 500 MG PO TB24: 500.0000 mg | ORAL_TABLET | Freq: Two times a day (BID) | ORAL | 0 refills | Status: DC

## 2021-07-18 ENCOUNTER — Other Ambulatory Visit: Payer: Self-pay | Admitting: Family Medicine

## 2021-07-25 ENCOUNTER — Other Ambulatory Visit: Payer: Self-pay | Admitting: Family Medicine

## 2021-07-25 MED ORDER — METFORMIN HCL ER 500 MG PO TB24: 500.0000 mg | ORAL_TABLET | Freq: Two times a day (BID) | ORAL | 0 refills | Status: DC

## 2021-07-25 NOTE — Telephone Encounter (Signed)
Requested medication (s) are due for refill today:   Requested medication (s) are on the active medication list: Yes  Last refill:  07/25/21  Future visit scheduled: Yes   Notes to clinic:  Pt. Has appointment scheduled for 11/07/21.    Requested Prescriptions  Pending Prescriptions Disp Refills   lisinopril-hydrochlorothiazide (ZESTORETIC) 10-12.5 MG tablet [Pharmacy Med Name: LISINOPRIL-HCTZ 10-12.5 MG TAB] 30 tablet 0    Sig: TAKE ONE TABLET BY MOUTH DAILY     Cardiovascular:  ACEI + Diuretic Combos Failed - 07/25/2021  5:48 AM      Failed - Na in normal range and within 180 days    Sodium  Date Value Ref Range Status  09/22/2020 139 134 - 144 mmol/L Final          Failed - K in normal range and within 180 days    Potassium  Date Value Ref Range Status  09/22/2020 3.9 3.5 - 5.2 mmol/L Final          Failed - Cr in normal range and within 180 days    Creat  Date Value Ref Range Status  05/09/2017 0.96 0.70 - 1.33 mg/dL Final    Comment:    For patients >83 years of age, the reference limit for Creatinine is approximately 13% higher for people identified as African-American. .    Creatinine, Ser  Date Value Ref Range Status  09/22/2020 1.02 0.76 - 1.27 mg/dL Final   Creatinine, POC  Date Value Ref Range Status  05/09/2017 n/a mg/dL Final          Failed - eGFR is 30 or above and within 180 days    GFR, Est African American  Date Value Ref Range Status  05/09/2017 100 > OR = 60 mL/min/1.83m Final   GFR calc Af Amer  Date Value Ref Range Status  10/20/2019 79 >59 mL/min/1.73 Final    Comment:    **[facility] currently reports eGFR in compliance with the current**   recommendations of the [employer] [facility] will   update reporting as new guidelines are published from the NKF-ASN   Task force.    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 [facility]  [facility]   1 year [facility]   2 years ago Need for shingles vaccine   [facility]   2 years ago Essential (primary) hypertension   [facility]       Future Appointments             In 1 month Agbor-Etang, [facility]   In 3 months Fisher, DoKirstie PeriMD [facility]  Passed - Patient is not pregnant      Passed - Last BP in normal range    BP Readings from Last 1 Encounters:  11/10/20 131/83           metFORMIN (GLUCOPHAGE-XR) 500 MG 24 hr tablet [Pharmacy Med Name: METFORMIN HCL XR 500 MG TABLET] 14 tablet 0    Sig: TAKE ONE TABLET BY MOUTH TWICE A DAY     Endocrinology:  Diabetes - Biguanides Failed - 07/25/2021  5:48 AM      Failed - HBA1C is between 0 and 7.9 and within 180 days    Hemoglobin A1C  Date Value Ref Range Status  09/11/2020 6.2 (A) 4.0 - 5.6 % Final   Hgb A1c MFr Bld  Date Value Ref Range Status  10/20/2019 6.4 (H) 4.8 - 5.6 % Final    Comment:             Prediabetes: 5.7 - 6.4          Diabetes: >6.4          Glycemic control for adults with diabetes: <7.0           Failed - B12 Level in normal range and within 720 days    No results found for: VITAMINB12        Failed - Valid encounter within last 6 months    Recent Outpatient Visits           8 months ago Annual physical exam    [facility] Birdie Sons, MD   10 months ago Type 2 diabetes mellitus with microalbuminuria, without long-term current use of insulin ([place])   [facility] Birdie Sons, MD   1 year ago Annual physical exam   [facility] Birdie Sons, MD   2 years ago Need for shingles vaccine   [facility] Birdie Sons, MD   2 years ago Essential (primary) hypertension   [place], Kirstie Peri, MD       Future Appointments             In 1 month Agbor-Etang, Aaron Edelman, MD [place]   In 3 months Fisher, Kirstie Peri, MD [facility]            Failed - CBC within normal limits and completed in the last 12 months    WBC  Date Value Ref Range Status  10/20/2019 6.8 3.4 - 10.8 x10E3/uL Final  07/01/2017 8.4 3.8 - 10.6 K/uL Final   RBC  Date Value Ref Range Status  10/20/2019 4.91 4.14 - 5.80 x10E6/uL Final  07/01/2017 5.10 4.40 - 5.90 MIL/uL Final   Hemoglobin  Date Value Ref Range Status  10/20/2019 15.2 13.0 - 17.7 g/dL Final   Hematocrit  Date Value Ref Range Status  10/20/2019 44.2 37.5 - 51.0 % Final   MCHC  Date Value Ref Range Status  10/20/2019 34.4 31.5 - 35.7 g/dL Final  07/01/2017 34.0 32.0 - 36.0 g/dL Final   [facility]  Date Value Ref Range Status  10/20/2019 31.0 26.6 - 33.0 pg Final  07/01/2017 30.3 26.0 - 34.0 pg Final   MCV  Date Value Ref Range Status  10/20/2019 90 79 - 97 fL Final   No results found for: PLTCOUNTKUC, LABPLAT, POCPLA RDW  Date Value Ref Range Status  10/20/2019 13.2 11.6 - 15.4 % Final         Passed - Cr  in normal range and within 360 days    Creat  Date Value Ref Range Status  05/09/2017 0.96 0.70 - 1.33 mg/dL Final    Comment:    For patients >16 years of age, the reference limit for Creatinine is approximately 13% higher for people identified as African-American. .    Creatinine, Ser  Date Value  Ref Range Status  09/22/2020 1.02 0.76 - 1.27 mg/dL Final   Creatinine, POC  Date Value Ref Range Status  05/09/2017 n/a mg/dL Final          Passed - eGFR in normal range and within 360 days    GFR, Est African American  Date Value Ref Range Status  05/09/2017 100 > OR = 60 mL/min/1.74m Final   GFR calc Af Amer  Date Value Ref Range Status  10/20/2019 79 >59 mL/min/1.73 Final    Comment:    **[facility] currently reports eGFR in compliance with the current**   recommendations of the [employer] [facility] will   update reporting as new guidelines are published from the NKF-ASN   Task force.    GFR, Est Non African American  Date Value Ref Range Status  05/09/2017 86 > OR = 60 mL/min/1.769mFinal   GFR calc non Af Amer  Date Value Ref Range Status  10/20/2019 68 >59 mL/min/1.73 Final   eGFR  Date Value Ref Range Status  09/22/2020 83 >59 mL/min/1.73 Final

## 2021-07-26 ENCOUNTER — Other Ambulatory Visit: Payer: Self-pay | Admitting: Family Medicine

## 2021-07-31 ENCOUNTER — Other Ambulatory Visit: Payer: Self-pay

## 2021-07-31 MED ORDER — ATORVASTATIN CALCIUM 80 MG PO TABS: 80.0000 mg | ORAL_TABLET | Freq: Every day | ORAL | 0 refills | Status: DC

## 2021-08-12 ENCOUNTER — Other Ambulatory Visit: Payer: Self-pay | Admitting: Family Medicine

## 2021-08-27 ENCOUNTER — Other Ambulatory Visit: Payer: Self-pay | Admitting: Family Medicine

## 2021-08-27 DIAGNOSIS — I10 Essential (primary) hypertension: Secondary | ICD-10-CM

## 2021-09-07 ENCOUNTER — Ambulatory Visit (INDEPENDENT_AMBULATORY_CARE_PROVIDER_SITE_OTHER): Payer: BC Managed Care – PPO | Admitting: Cardiology

## 2021-09-07 ENCOUNTER — Encounter: Payer: Self-pay | Admitting: Cardiology

## 2021-09-07 VITALS — BP 152/88 | HR 73 | Ht 70.0 in | Wt 235.0 lb

## 2021-09-07 DIAGNOSIS — E7800 Pure hypercholesterolemia, unspecified: Secondary | ICD-10-CM | POA: Diagnosis not present

## 2021-09-07 DIAGNOSIS — I2510 Atherosclerotic heart disease of native coronary artery without angina pectoris: Secondary | ICD-10-CM | POA: Diagnosis not present

## 2021-09-07 DIAGNOSIS — I10 Essential (primary) hypertension: Secondary | ICD-10-CM | POA: Diagnosis not present

## 2021-09-07 MED ORDER — ATORVASTATIN CALCIUM 80 MG PO TABS: 80.0000 mg | ORAL_TABLET | Freq: Every day | ORAL | 1 refills | Status: DC

## 2021-09-07 MED ORDER — LISINOPRIL-HYDROCHLOROTHIAZIDE 20-25 MG PO TABS: 1.0000 | ORAL_TABLET | Freq: Every day | ORAL | 1 refills | Status: DC

## 2021-09-07 NOTE — Patient Instructions (Signed)
Medication Instructions:  ?Your physician has recommended you make the following change in your medication:  ? ?INCREASE Lisinopril HCTZ to 20-25 mg daily. An Rx has been sent to your pharmacy. ? ?*If you need a refill on your cardiac medications before your next appointment, please call your pharmacy* ? ? ?Lab Work: ?None ordered ?If you have labs (blood work) drawn today and your tests are completely normal, you will receive your results only by: ?MyChart Message (if you have MyChart) OR ?A paper copy in the mail ?If you have any lab test that is abnormal or we need to change your treatment, we will call you to review the results. ? ? ?Testing/Procedures: ?None ordered ? ? ?Follow-Up: ?At [facility], you and your health needs are our priority.  As part of our continuing mission to provide you with exceptional heart care, we have created designated Provider Care Teams.  These Care Teams include your primary Cardiologist (physician) and Advanced Practice Providers (APPs -  Physician Assistants and Nurse Practitioners) who all work together to provide you with the care you need, when you need it. ? ?We recommend signing up for the patient portal called "MyChart".  Sign up information is provided on this After Visit Summary.  MyChart is used to connect with patients for Virtual Visits (Telemedicine).  Patients are able to view lab/test results, encounter notes, upcoming appointments, etc.  Non-urgent messages can be sent to your provider as well.   ?To learn more about what you can do with MyChart, go to [email or web address].   ? ?Your next appointment:   ?Your physician wants you to follow-up in: 6 months You will receive a reminder letter in the mail two months in advance. If you don't receive a letter, please call our office to schedule the follow-up appointment. ? ? ?The format for your next appointment:   ?In Person ? ?Provider:   ?Kate Sable, MD{ ? ? ? ?Other Instructions ?N/A ? ?

## 2021-09-07 NOTE — Progress Notes (Signed)
?Cardiology Office Note:   ? ?Date:  09/07/2021  ? ?ID:  Jeffery BridegroomKeith A Trott, DOB 05-14-1956, MRN [id number] ? ?PCP:  Malva LimesFisher, Donald E, MD ?  ?[facility]  ?Cardiologist:  Debbe OdeaBrian Agbor-Etang, MD  ?Advanced Practice Provider:  No care team member to display ?Electrophysiologist:  None  ?    ? ?Referring MD: Malva LimesFisher, Donald E, MD  ? ?Chief Complaint  ?Patient presents with  ? Other  ?  6 month follow up -- Meds reviewed verbally with patient.   ? ? ?History of Present Illness:   ? ?Jeffery Norman is a 66 y.o. male with a hx of CAD (CCTA 10/2020 moderate LAD, mild RCA and Lcx dz), hypertension, hyperlipidemia, diabetes, former smoker x30 years who presents for follow-up.   ? ?Patient is being seen for history of CAD and hypertension.  Denies chest pain, blood pressures have been elevated of late.  Endorses eating salty foods.  Compliant with BP meds as prescribed.  He has no other concerns at this time. ? ?Prior notes ?Echo 10/2018 showed normal systolic function, EF 55 to 60%, impaired relaxation. ?Coronary CTA on 10/12/2020 calcium score 1767, moderate LAD stenosis, mild RCA and left circumflex disease.  FFR not obtained due to significant artifact. ? ?Past Medical History:  ?Diagnosis Date  ? Carpal tunnel syndrome 11/10/2008  ? Diabetes mellitus without complication (HCC)   ? GERD (gastroesophageal reflux disease)   ? Gout   ? History of chicken pox   ? History of measles   ? History of mumps   ? Hypertension   ? ? ?Past Surgical History:  ?Procedure Laterality Date  ? APPENDECTOMY  1960's  ? CARPAL TUNNEL RELEASE Left 10/02/2016  ? Procedure: CARPAL TUNNEL RELEASE;  Surgeon: Deeann SaintHoward Miller, MD;  Location: [facility] ORS;  Service: Orthopedics;  Laterality: Left;  ? index finger surgery  1981  ? ? ?Current Medications: ?Current Meds  ?Medication Sig  ? aspirin EC 81 MG tablet Take 81 mg by mouth daily. Swallow whole.  ? Cholecalciferol (VITAMIN D3) 25 MCG (1000 UT) CAPS Take 1 capsule by mouth daily.  ? Coenzyme  Q10 100 MG capsule Take 100 mg by mouth daily.  ? glucose blood test strip 1 strip daily.   ? lisinopril-hydrochlorothiazide (ZESTORETIC) 20-25 MG tablet Take 1 tablet by mouth daily.  ? metFORMIN (GLUCOPHAGE-XR) 500 MG 24 hr tablet TAKE ONE TABLET BY MOUTH TWICE A DAY  ? Omega-3 Fatty Acids (FISH OIL) 1000 MG CAPS Take 2 capsules by mouth daily.  ? omeprazole (PRILOSEC) 40 MG capsule TAKE ONE CAPSULE BY MOUTH DAILY  ? Potassium 99 MG TABS Take 2 tablets by mouth daily.  ? sildenafil (REVATIO) 20 MG tablet TAKE 3-5 TABLETS AS NEEDED PRIOR TO INTERCOURSE. NOT TO EXCEED 5 TABLETS IN A DAY.  ? valACYclovir (VALTREX) 1000 MG tablet TAKE ONE TABLET BY MOUTH EVERY 8 HOURS FOR UP TO 7 DAYS AS NEEDED  ? [DISCONTINUED] aspirin EC 81 MG tablet Take 2 tablets (162 mg total) by mouth daily. Swallow whole. (Patient taking differently: Take 81 mg by mouth daily. Swallow whole.)  ? [DISCONTINUED] atorvastatin (LIPITOR) 80 MG tablet Take 1 tablet (80 mg total) by mouth daily. Patient will need an appt for further refills. Thanks!  ? [DISCONTINUED] lisinopril-hydrochlorothiazide (ZESTORETIC) 10-12.5 MG tablet TAKE ONE TABLET BY MOUTH DAILY  ?  ? ?Allergies:   Naproxen  ? ?Social History  ? ?Socioeconomic History  ? Marital status: Married  ?  Spouse name:  Not on file  ? Number of children: 1  ? Years of education: Not on file  ? Highest education level: Not on file  ?Occupational History  ? Occupation: Chartered certified accountant  ?Tobacco Use  ? Smoking status: Former  ?  Packs/day: 0.50  ?  Years: 25.00  ?  Pack years: 12.50  ?  Types: E-cigarettes, Cigarettes  ?  Quit date: 08/02/2011  ?  Years since quitting: 10.1  ? Smokeless tobacco: Never  ?Substance and Sexual Activity  ? Alcohol use: Yes  ?  Alcohol/week: 0.0 standard drinks  ?  Comment: moderate use  ? Drug use: No  ? Sexual activity: Not on file  ?Other Topics Concern  ? Not on file  ?Social History Narrative  ? Not on file  ? ?Social Determinants of Health  ? ?Financial Resource Strain: Not  on file  ?Food Insecurity: Not on file  ?Transportation Needs: Not on file  ?Physical Activity: Not on file  ?Stress: Not on file  ?Social Connections: Not on file  ?  ? ?Family History: ?The patient's family history includes Diabetes in his brother, mother, and another family member; Heart attack in his maternal grandmother; Hypertension in his mother. ? ?ROS:   ?Please see the history of present illness.    ? All other systems reviewed and are negative. ? ?EKGs/Labs/Other Studies Reviewed:   ? ?The following studies were reviewed today: ? ? ?EKG:  EKG is ordered today.  EKG shows normal sinus rhythm. ? ?Recent Labs: ?09/22/2020: BUN 16; Creatinine, Ser 1.02; Potassium 3.9; Sodium 139  ?Recent Lipid Panel ?   ?Component Value Date/Time  ? CHOL 184 10/20/2020 0708  ? CHOL 176 10/20/2019 0910  ? TRIG 163 (H) 10/20/2020 0708  ? HDL 38 (L) 10/20/2020 0708  ? HDL 41 10/20/2019 0910  ? CHOLHDL 4.8 10/20/2020 0708  ? VLDL 33 10/20/2020 0708  ? LDLCALC 113 (H) 10/20/2020 0708  ? LDLCALC 112 (H) 10/20/2019 0910  ? LDLCALC 99 05/09/2017 0959  ? ? ? ?Risk Assessment/Calculations:   ? ? ? ?Physical Exam:   ? ?VS:  BP (!) 152/88 (BP Location: Left Arm, Patient Position: Sitting, Cuff Size: Normal)   Pulse 73   Ht 5\' 10"  (1.778 m)   Wt 235 lb (106.6 kg)   SpO2 96%   BMI 33.72 kg/m?    ? ?Wt Readings from Last 3 Encounters:  ?09/07/21 235 lb (106.6 kg)  ?11/10/20 235 lb (106.6 kg)  ?11/01/20 235 lb 9.6 oz (106.9 kg)  ?  ? ?GEN:  Well nourished, well developed in no acute distress ?HEENT: Normal ?NECK: No JVD; No carotid bruits ?CARDIAC: RRR, no murmurs, rubs, gallops ?RESPIRATORY:  Clear to auscultation without rales, wheezing or rhonchi  ?ABDOMEN: Soft, non-tender, non-distended ?MUSCULOSKELETAL:  No edema; No deformity  ?SKIN: Warm and dry ?NEUROLOGIC:  Alert and oriented x 3 ?PSYCHIATRIC:  Normal affect  ? ?ASSESSMENT:   ? ?1. Coronary artery disease involving native coronary artery of native heart, unspecified whether  angina present   ?2. Primary hypertension   ?3. Pure hypercholesterolemia   ? ?PLAN:   ? ?In order of problems listed above: ? ?CAD, moderate LAD, mild RCA and left circumflex disease.  Calcium score 1767.  Denies chest pain.  Continue aspirin 81 mg, Lipitor 80 mg.  Echo with preserved EF.   ?Hypertension, BP elevated.  Increase Zestoretic to 20-25 mg daily. ?Hyperlipidemia, continue Lipitor 80. ? ?Follow-up in 6 months. ?  ? ? ?Medication Adjustments/Labs and  Tests Ordered: ?Current medicines are reviewed at length with the patient today.  Concerns regarding medicines are outlined above.  ?Orders Placed This Encounter  ?Procedures  ? EKG 12-Lead  ? ? ?Meds ordered this encounter  ?Medications  ? atorvastatin (LIPITOR) 80 MG tablet  ?  Sig: Take 1 tablet (80 mg total) by mouth daily.  ?  Dispense:  90 tablet  ?  Refill:  1  ? lisinopril-hydrochlorothiazide (ZESTORETIC) 20-25 MG tablet  ?  Sig: Take 1 tablet by mouth daily.  ?  Dispense:  90 tablet  ?  Refill:  1  ?  Dosage increase  ? ? ? ?Patient Instructions  ?Medication Instructions:  ?Your physician has recommended you make the following change in your medication:  ? ?INCREASE Lisinopril HCTZ to 20-25 mg daily. An Rx has been sent to your pharmacy. ? ?*If you need a refill on your cardiac medications before your next appointment, please call your pharmacy* ? ? ?Lab Work: ?None ordered ?If you have labs (blood work) drawn today and your tests are completely normal, you will receive your results only by: ?MyChart Message (if you have MyChart) OR ?A paper copy in the mail ?If you have any lab test that is abnormal or we need to change your treatment, we will call you to review the results. ? ? ?Testing/Procedures: ?None ordered ? ? ?Follow-Up: ?At [facility], you and your health needs are our priority.  As part of our continuing mission to provide you with exceptional heart care, we have created designated Provider Care Teams.  These Care Teams include your  primary Cardiologist (physician) and Advanced Practice Providers (APPs -  Physician Assistants and Nurse Practitioners) who all work together to provide you with the care you need, when you need it. ? ?We reco

## 2021-09-24 ENCOUNTER — Other Ambulatory Visit: Payer: Self-pay | Admitting: Family Medicine

## 2021-11-06 NOTE — Progress Notes (Signed)
I,Sulibeya S Dimas,acting as a Education administrator for Lelon Huh, MD.,have documented all relevant documentation on the behalf of Lelon Huh, MD,as directed by  Lelon Huh, MD while in the presence of Lelon Huh, MD.   Complete physical exam   Patient: Jeffery Norman   DOB: 1955-12-10   66 y.o. Male  MRN: [id number] Visit Date: 11/07/2021  Today's healthcare provider: Lelon Huh, MD   Chief Complaint  Patient presents with   Annual Exam   Subjective    Jeffery Norman is a 66 y.o. male who presents today for a complete physical exam.  He reports consuming a general diet. Gym/ health club routine includes cardio. He generally feels fairly well. He reports sleeping poorly. He does have additional problems to discuss today.  HPI  Diabetes Mellitus Type II, follow-up  Lab Results  Component Value Date   HGBA1C 6.2 (A) 09/11/2020   HGBA1C 6.4 (H) 10/20/2019   HGBA1C 6.0 (H) 10/16/2018   Last seen for diabetes 1 years ago.  Management since then includes continuing the same treatment. He reports excellent compliance with treatment. He is not having side effects.   Home blood sugar records: fasting range: 135  Episodes of hypoglycemia? No    Current insulin regiment: none   --------------------------------------------------------------------------------------------------- Hypertension, follow-up  BP Readings from Last 3 Encounters:  11/07/21 128/80  09/07/21 (!) 152/88  11/10/20 131/83   Wt Readings from Last 3 Encounters:  11/07/21 233 lb 6.4 oz (105.9 kg)  09/07/21 235 lb (106.6 kg)  11/10/20 235 lb (106.6 kg)     He was last seen for hypertension 2 months ago by Cardiology. No labs ordered. BP at that visit was 152/88. Management since that visit includes Increase Zestoretic to 20-25 mg daily. He reports excellent compliance with treatment. He is not having side effects.   --------------------------------------------------------------------------------------------------- Lipid/Cholesterol, follow-up  Last Lipid Panel: Lab Results  Component Value Date   CHOL 184 10/20/2020   LDLCALC 113 (H) 10/20/2020   HDL 38 (L) 10/20/2020   TRIG 163 (H) 10/20/2020    He was last seen for this 2 months ago by cardiology. No labs ordered. Management since that visit includes continue Lipitor 80.  He reports excellent compliance with treatment. He is not having side effects.   Last metabolic panel Lab Results  Component Value Date   GLUCOSE 92 09/22/2020   NA 139 09/22/2020   K 3.9 09/22/2020   BUN 16 09/22/2020   CREATININE 1.02 09/22/2020   EGFR 83 09/22/2020   GFRNONAA 68 10/20/2019   CALCIUM 9.5 09/22/2020   AST 27 10/20/2019   ALT 35 10/20/2019   The 10-year ASCVD risk score (Arnett DK, et al., 2019) is: 25.5%  ---------------------------------------------------------------------------------------------------   Past Medical History:  Diagnosis Date   Carpal tunnel syndrome 11/10/2008   Diabetes mellitus without complication (HCC)    GERD (gastroesophageal reflux disease)    Gout    History of chicken pox    History of measles    History of mumps    Hypertension    Past Surgical History:  Procedure Laterality Date   APPENDECTOMY  1960's   CARPAL TUNNEL RELEASE Left 10/02/2016   Procedure: CARPAL TUNNEL RELEASE;  Surgeon: Earnestine Leys, MD;  Location: [facility] ORS;  Service: Orthopedics;  Laterality: Left;   index finger surgery  1981   Social History   Socioeconomic History   Marital status: Married    Spouse name: Not on file   Number of children: 1  Years of education: Not on file   Highest education level: Not on file  Occupational History   Occupation: Machinist  Tobacco Use   Smoking status: Former    Packs/day: 0.50    Years: 25.00    Pack years: 12.50    Types: E-cigarettes, Cigarettes    Quit date: 08/02/2011    Years since  quitting: 10.2   Smokeless tobacco: Never  Substance and Sexual Activity   Alcohol use: Yes    Alcohol/week: 0.0 standard drinks    Comment: moderate use   Drug use: No   Sexual activity: Not on file  Other Topics Concern   Not on file  Social History Narrative   Not on file   Social Determinants of Health   Financial Resource Strain: Not on file  Food Insecurity: Not on file  Transportation Needs: Not on file  Physical Activity: Not on file  Stress: Not on file  Social Connections: Not on file  Intimate Partner Violence: Not on file   Family Status  Relation Name Status   Mother  Deceased at age 58       septic shock   Father  Deceased at age 75       accidental   Brother  Alive   Son  Alive   MGM  Deceased at age 64's   Other  (Not Specified)   Family History  Problem Relation Age of Onset   Diabetes Mother    Hypertension Mother    Diabetes Brother    Heart attack Maternal Grandmother    Diabetes Other    Allergies  Allergen Reactions   Naproxen Anaphylaxis    Onset 72mnute after taken one naproxen on 07-01-17, although had taken naproxen many times before and had been on daily ECASA    Patient Care Team: FBirdie Sons MD as PCP - General (Family Medicine) AKate Sable MD as PCP - Cardiology (Cardiology)   Medications: Outpatient Medications Prior to Visit  Medication Sig   aspirin EC 81 MG tablet Take 81 mg by mouth daily. Swallow whole.   atorvastatin (LIPITOR) 80 MG tablet Take 1 tablet (80 mg total) by mouth daily.   Cholecalciferol (VITAMIN D3) 25 MCG (1000 UT) CAPS Take 1 capsule by mouth daily.   Coenzyme Q10 100 MG capsule Take 100 mg by mouth daily.   cyanocobalamin 1000 MCG tablet Take 1,000 mcg by mouth daily.   glucose blood test strip 1 strip daily.    lisinopril-hydrochlorothiazide (ZESTORETIC) 20-25 MG tablet Take 1 tablet by mouth daily.   Omega-3 Fatty Acids (FISH OIL) 1000 MG CAPS Take 2 capsules by mouth daily.    omeprazole (PRILOSEC) 40 MG capsule TAKE ONE CAPSULE BY MOUTH DAILY   Potassium 99 MG TABS Take 2 tablets by mouth daily.   sildenafil (REVATIO) 20 MG tablet TAKE 3-5 TABLETS AS NEEDED PRIOR TO INTERCOURSE. NOT TO EXCEED 5 TABLETS IN A DAY.   valACYclovir (VALTREX) 1000 MG tablet TAKE ONE TABLET BY MOUTH EVERY 8 HOURS FOR UP TO 7 DAYS AS NEEDED   metFORMIN (GLUCOPHAGE) 500 MG tablet TAKE ONE TABLET BY MOUTH TWICE A DAY   metFORMIN (GLUCOPHAGE-XR) 500 MG 24 hr tablet TAKE ONE TABLET BY MOUTH TWICE A DAY   No facility-administered medications prior to visit.    Review of Systems  Psychiatric/Behavioral:  Positive for sleep disturbance.   All other systems reviewed and are negative.    Objective     BP 128/80 (BP Location: Right Arm,  Patient Position: Sitting, Cuff Size: Large)   Pulse 78   Temp 98 F (36.7 C) (Oral)   Resp 16   Ht '5\' 10"'  (1.778 m)   Wt 233 lb 6.4 oz (105.9 kg)   BMI 33.49 kg/m     Physical Exam   General Appearance:    Obese male. Alert, cooperative, in no acute distress, appears stated age  Head:    Normocephalic, without obvious abnormality, atraumatic  Eyes:    PERRL, conjunctiva/corneas clear, EOM's intact, fundi    benign, both eyes       Ears:    Normal TM's and external ear canals, both ears  Nose:   Nares normal, septum midline, mucosa normal, no drainage   or sinus tenderness  Throat:   Lips, mucosa, and tongue normal; teeth and gums normal  Neck:   Supple, symmetrical, trachea midline, no adenopathy;       thyroid:  No enlargement/tenderness/nodules; no carotid   bruit or JVD  Back:     Symmetric, no curvature, ROM normal, no CVA tenderness  Lungs:     Clear to auscultation bilaterally, respirations unlabored  Chest wall:    No tenderness or deformity  Heart:    Normal heart rate. Normal rhythm. No murmurs, rubs, or gallops.  S1 and S2 normal  Abdomen:     Soft, non-tender, bowel sounds active all four quadrants,    no masses, no organomegaly   Genitalia:    deferred  Rectal:    deferred  Extremities:   All extremities are intact. No cyanosis or edema  Pulses:   2+ and symmetric all extremities  Skin:   Skin color, texture, turgor normal, no rashes or lesions  Lymph nodes:   Cervical, supraclavicular, and axillary nodes normal  Neurologic:   CNII-XII intact. Normal strength, sensation and reflexes      throughout     Last depression screening scores    11/07/2021    9:11 AM 11/01/2020    9:05 AM 09/11/2020   11:36 AM  PHQ 2/9 Scores  PHQ - 2 Score 0 0 0  PHQ- 9 Score 3 0 3   Last fall risk screening    11/07/2021    9:10 AM  [place] in the past year? 0  Number falls in past yr: 0  Injury with Fall? 0  Risk for fall due to : No Fall Risks  Follow up Falls evaluation completed   Last Audit-C alcohol use screening    11/07/2021    9:11 AM  Alcohol Use Disorder Test (AUDIT)  1. How often do you have a drink containing alcohol? 3  2. How many drinks containing alcohol do you have on a typical day when you are drinking? 1  3. How often do you have six or more drinks on one occasion? 0  AUDIT-C Score 4  4. How often during the last year have you found that you were not able to stop drinking once you had started? 0  5. How often during the last year have you failed to do what was normally expected from you because of drinking? 0  6. How often during the last year have you needed a first drink in the morning to get yourself going after a heavy drinking session? 0  7. How often during the last year have you had a feeling of guilt of remorse after drinking? 0  8. How often during the last year have you been unable  to remember what happened the night before because you had been drinking? 0  9. Have you or someone else been injured as a result of your drinking? 0  10. Has a relative or friend or a doctor or another health worker been concerned about your drinking or suggested you cut down? 0  Alcohol Use Disorder  Identification Test Final Score (AUDIT) 4   A score of 3 or more in women, and 4 or more in men indicates increased risk for alcohol abuse, EXCEPT if all of the points are from question 1   No results found for any visits on 11/07/21.  Assessment & Plan    Routine Health Maintenance and Physical Exam  Exercise Activities and Dietary recommendations  Goals   None     Immunization History  Administered Date(s) Administered   Influenza,inj,Quad PF,6+ Mos 03/26/2019   Influenza-Unspecified 04/03/2017   Pneumococcal Polysaccharide-23 05/09/2017   Td 03/28/1997   Tdap 10/22/2007, 10/16/2018   Zoster Recombinat (Shingrix) 10/16/2018, 03/26/2019    Health Maintenance  Topic Date Due   COVID-19 Vaccine (1) Never done   FOOT EXAM  Never done   OPHTHALMOLOGY EXAM  02/18/2021   HEMOGLOBIN A1C  03/13/2021   INFLUENZA VACCINE  01/01/2022   COLONOSCOPY (Pts 45-22yr Insurance coverage will need to be confirmed)  05/14/2022   TETANUS/TDAP  10/15/2028   Hepatitis C Screening  Completed   HIV Screening  Completed   Zoster Vaccines- Shingrix  Completed   HPV VACCINES  Aged Out    Discussed health benefits of physical activity, and encouraged him to engage in regular exercise appropriate for his age and condition.   2. Type 2 diabetes mellitus with microalbuminuria, without long-term [place] use of insulin [place] He is doing well with diet and would like to get off of metformin. Can d/c if a1c looks good and will recheck in 3-4 months.  - Urine microalbumin-creatinine with uACR - Hemoglobin A1c  3. Coronary artery disease involving native coronary artery of native heart without angina pectoris Asymptomatic. Compliant with medication.  Continue aggressive risk factor modification.    4. Prostate cancer screening  - PSA Total (Reflex To Free) ([facility] only)  5. Primary hypertension Well controlled.  Continue current medications.    6. Pure hypercholesterolemia He is tolerating  rosuvastatin well with no adverse effects.   - CBC - Comprehensive metabolic panel - Lipid panel  7. Situational stress Doing well with prn alprazolam which he states he take periodicall.y   8. Insomnia, unspecified type refill- ALPRAZolam (XANAX) 0.5 MG tablet; Take 1 tablet (0.5 mg total) by mouth at bedtime.  Dispense: 20 tablet; Refill: 3  9. Tinnitus of left ear He states this is what he is taking vitamin d for, and feels it is helping.        The entirety of the information documented in the History of Present Illness, Review of Systems and Physical Exam were personally obtained by me. Portions of this information were initially documented by the CMA and reviewed by me for thoroughness and accuracy.     DLelon Huh MD  [phone](phone) [phone](fax)  [place]

## 2021-11-07 ENCOUNTER — Ambulatory Visit (INDEPENDENT_AMBULATORY_CARE_PROVIDER_SITE_OTHER): Payer: BC Managed Care – PPO | Admitting: Family Medicine

## 2021-11-07 ENCOUNTER — Encounter: Payer: Self-pay | Admitting: Family Medicine

## 2021-11-07 VITALS — BP 128/80 | HR 78 | Temp 98.0°F | Resp 16 | Ht 70.0 in | Wt 233.4 lb

## 2021-11-07 DIAGNOSIS — E1129 Type 2 diabetes mellitus with other diabetic kidney complication: Secondary | ICD-10-CM | POA: Diagnosis not present

## 2021-11-07 DIAGNOSIS — I10 Essential (primary) hypertension: Secondary | ICD-10-CM | POA: Diagnosis not present

## 2021-11-07 DIAGNOSIS — I2510 Atherosclerotic heart disease of native coronary artery without angina pectoris: Secondary | ICD-10-CM

## 2021-11-07 DIAGNOSIS — Z125 Encounter for screening for malignant neoplasm of prostate: Secondary | ICD-10-CM | POA: Diagnosis not present

## 2021-11-07 DIAGNOSIS — E7800 Pure hypercholesterolemia, unspecified: Secondary | ICD-10-CM

## 2021-11-07 DIAGNOSIS — Z0000 Encounter for general adult medical examination without abnormal findings: Secondary | ICD-10-CM | POA: Diagnosis not present

## 2021-11-07 DIAGNOSIS — G4700 Insomnia, unspecified: Secondary | ICD-10-CM

## 2021-11-07 DIAGNOSIS — R809 Proteinuria, unspecified: Secondary | ICD-10-CM | POA: Diagnosis not present

## 2021-11-07 DIAGNOSIS — H9312 Tinnitus, left ear: Secondary | ICD-10-CM

## 2021-11-07 DIAGNOSIS — F439 Reaction to severe stress, unspecified: Secondary | ICD-10-CM

## 2021-11-07 MED ORDER — ALPRAZOLAM 0.5 MG PO TABS: 0.5000 mg | ORAL_TABLET | Freq: Every day | ORAL | 3 refills | Status: DC

## 2021-11-07 NOTE — Patient Instructions (Signed)
Please review the attached list of medications and notify my office if there are any errors.    

## 2021-11-08 LAB — COMPREHENSIVE METABOLIC PANEL
ALT: 32 IU/L (ref 0–44)
AST: 26 IU/L (ref 0–40)
Albumin/Globulin Ratio: 1.9 (ref 1.2–2.2)
Albumin: 4.6 g/dL (ref 3.8–4.8)
Alkaline Phosphatase: 92 IU/L (ref 44–121)
BUN/Creatinine Ratio: 19 (ref 10–24)
BUN: 21 mg/dL (ref 8–27)
Bilirubin Total: 0.6 mg/dL (ref 0.0–1.2)
CO2: 25 mmol/L (ref 20–29)
Calcium: 9.8 mg/dL (ref 8.6–10.2)
Chloride: 98 mmol/L (ref 96–106)
Creatinine, Ser: 1.11 mg/dL (ref 0.76–1.27)
Globulin, Total: 2.4 g/dL (ref 1.5–4.5)
Glucose: 120 mg/dL — ABNORMAL HIGH (ref 70–99)
Potassium: 4.6 mmol/L (ref 3.5–5.2)
Sodium: 136 mmol/L (ref 134–144)
Total Protein: 7 g/dL (ref 6.0–8.5)
eGFR: 75 mL/min/{1.73_m2} (ref >59)

## 2021-11-08 LAB — HEMOGLOBIN A1C
Est. average glucose Bld gHb Est-mCnc: 134 mg/dL
Hgb A1c MFr Bld: 6.3 % — ABNORMAL HIGH (ref 4.8–5.6)

## 2021-11-08 LAB — LIPID PANEL
Chol/HDL Ratio: 4.1 ratio (ref 0.0–5.0)
Cholesterol, Total: 159 mg/dL (ref 100–199)
HDL: 39 mg/dL — ABNORMAL LOW (ref >39)
LDL Chol Calc (NIH): 90 mg/dL (ref 0–99)
Triglycerides: 172 mg/dL — ABNORMAL HIGH (ref 0–149)
VLDL Cholesterol Cal: 30 mg/dL (ref 5–40)

## 2021-11-08 LAB — CBC
Hematocrit: 41.6 % (ref 37.5–51.0)
Hemoglobin: 14.6 g/dL (ref 13.0–17.7)
MCH: 31.6 pg (ref 26.6–33.0)
MCHC: 35.1 g/dL (ref 31.5–35.7)
MCV: 90 fL (ref 79–97)
Platelets: 199 10*3/uL (ref 150–450)
RBC: 4.62 x10E6/uL (ref 4.14–5.80)
RDW: 13.2 % (ref 11.6–15.4)
WBC: 5.6 10*3/uL (ref 3.4–10.8)

## 2021-11-08 LAB — MICROALBUMIN / CREATININE URINE RATIO
Creatinine, Urine: 78 mg/dL
Microalb/Creat Ratio: 9 mg/g creat (ref 0–29)
Microalbumin, Urine: 7.1 ug/mL

## 2021-11-08 LAB — PSA TOTAL (REFLEX TO FREE): Prostate Specific Ag, Serum: 1.2 ng/mL (ref 0.0–4.0)

## 2022-01-30 ENCOUNTER — Other Ambulatory Visit: Payer: Self-pay | Admitting: Family Medicine

## 2022-01-30 DIAGNOSIS — G4700 Insomnia, unspecified: Secondary | ICD-10-CM

## 2022-03-11 ENCOUNTER — Encounter: Payer: Self-pay | Admitting: Family Medicine

## 2022-03-11 ENCOUNTER — Ambulatory Visit (INDEPENDENT_AMBULATORY_CARE_PROVIDER_SITE_OTHER): Payer: BC Managed Care – PPO | Admitting: Family Medicine

## 2022-03-11 VITALS — BP 137/82 | HR 78 | Temp 98.6°F | Resp 14 | Wt 229.0 lb

## 2022-03-11 DIAGNOSIS — I10 Essential (primary) hypertension: Secondary | ICD-10-CM | POA: Diagnosis not present

## 2022-03-11 DIAGNOSIS — Z23 Encounter for immunization: Secondary | ICD-10-CM | POA: Diagnosis not present

## 2022-03-11 DIAGNOSIS — I2510 Atherosclerotic heart disease of native coronary artery without angina pectoris: Secondary | ICD-10-CM | POA: Diagnosis not present

## 2022-03-11 DIAGNOSIS — E1129 Type 2 diabetes mellitus with other diabetic kidney complication: Secondary | ICD-10-CM

## 2022-03-11 DIAGNOSIS — R809 Proteinuria, unspecified: Secondary | ICD-10-CM

## 2022-03-11 DIAGNOSIS — E7800 Pure hypercholesterolemia, unspecified: Secondary | ICD-10-CM | POA: Diagnosis not present

## 2022-03-11 LAB — POCT GLYCOSYLATED HEMOGLOBIN (HGB A1C)
Est. average glucose Bld gHb Est-mCnc: 131
Hemoglobin A1C: 6.2 % — AB (ref 4.0–5.6)

## 2022-03-11 MED ORDER — METFORMIN HCL 500 MG PO TABS: 500.0000 mg | ORAL_TABLET | Freq: Every day | ORAL | Status: DC

## 2022-03-11 MED ORDER — ATORVASTATIN CALCIUM 80 MG PO TABS: 80.0000 mg | ORAL_TABLET | Freq: Every day | ORAL | 2 refills | Status: DC

## 2022-03-11 MED ORDER — LISINOPRIL-HYDROCHLOROTHIAZIDE 20-25 MG PO TABS: 1.0000 | ORAL_TABLET | Freq: Every day | ORAL | 2 refills | Status: DC

## 2022-03-11 NOTE — Patient Instructions (Signed)
.   Please review the attached list of medications and notify my office if there are any errors.   . Please bring all of your medications to every appointment so we can make sure that our medication list is the same as yours.   . Please contact your eyecare professional to schedule a routine eye exam    

## 2022-03-11 NOTE — Progress Notes (Signed)
I,Roshena L Chambers,acting as a scribe for Lelon Huh, MD.,have documented all relevant documentation on the behalf of Lelon Huh, MD,as directed by  Lelon Huh, MD while in the presence of Lelon Huh, MD.    Established patient visit   Patient: Jeffery Norman   DOB: 06-Apr-1956   66 y.o. Male  MRN: [id number] Visit Date: 03/11/2022  Today's healthcare provider: Lelon Huh, MD   Chief Complaint  Patient presents with   Diabetes   Subjective    HPI  Diabetes Mellitus Type II, Follow-up  Lab Results  Component Value Date   HGBA1C 6.2 (A) 03/11/2022   HGBA1C 6.3 (H) 11/07/2021   HGBA1C 6.2 (A) 09/11/2020   Wt Readings from Last 3 Encounters:  03/11/22 229 lb (103.9 kg)  11/07/21 233 lb 6.4 oz (105.9 kg)  09/07/21 235 lb (106.6 kg)   Last seen for diabetes 4 months ago.  Management since then includes reduce metformin to 1 tablet ONCE daily instead of twice daily.Marland Kitchen He reports good compliance with treatment. He is not having side effects.  Symptoms: No fatigue No foot ulcerations  No appetite changes No nausea  No paresthesia of the feet  No polydipsia  No polyuria No visual disturbances   No vomiting     Home blood sugar records: fasting range: 125  Episodes of hypoglycemia? No    Current insulin regiment: none Most Recent Eye Exam: >1 year ago Current exercise: walking Current diet habits: in general, an "unhealthy" diet  Pertinent Labs: Lab Results  Component Value Date   CHOL 159 11/07/2021   HDL 39 (L) 11/07/2021   LDLCALC 90 11/07/2021   TRIG 172 (H) 11/07/2021   CHOLHDL 4.1 11/07/2021   Lab Results  Component Value Date   NA 136 11/07/2021   K 4.6 11/07/2021   CREATININE 1.11 11/07/2021   EGFR 75 11/07/2021   MICROALBUR 50 10/16/2018   LABMICR 7.1 11/07/2021     ---------------------------------------------------------------------------------------------------   Medications: Outpatient Medications Prior to Visit   Medication Sig   ALPRAZolam (XANAX) 0.5 MG tablet TAKE 1 TABLET BY MOUTH AT BEDTIME   aspirin EC 81 MG tablet Take 81 mg by mouth daily. Swallow whole.   Cholecalciferol (VITAMIN D3) 25 MCG (1000 UT) CAPS Take 1 capsule by mouth daily.   Coenzyme Q10 100 MG capsule Take 100 mg by mouth daily.   cyanocobalamin 1000 MCG tablet Take 1,000 mcg by mouth daily.   glucose blood test strip 1 strip daily.    lisinopril-hydrochlorothiazide (ZESTORETIC) 20-25 MG tablet Take 1 tablet by mouth daily.   metFORMIN (GLUCOPHAGE) 500 MG tablet TAKE ONE TABLET BY MOUTH TWICE A DAY (Patient taking differently: Take 500 mg by mouth daily with breakfast.)   Omega-3 Fatty Acids (FISH OIL) 1000 MG CAPS Take 2 capsules by mouth daily.   omeprazole (PRILOSEC) 40 MG capsule TAKE ONE CAPSULE BY MOUTH DAILY   Potassium 99 MG TABS Take 2 tablets by mouth daily.   sildenafil (REVATIO) 20 MG tablet TAKE 3-5 TABLETS AS NEEDED PRIOR TO INTERCOURSE. NOT TO EXCEED 5 TABLETS IN A DAY.   valACYclovir (VALTREX) 1000 MG tablet TAKE ONE TABLET BY MOUTH EVERY 8 HOURS FOR UP TO 7 DAYS AS NEEDED   atorvastatin (LIPITOR) 80 MG tablet Take 1 tablet (80 mg total) by mouth daily.   No facility-administered medications prior to visit.    Review of Systems  Constitutional:  Negative for appetite change, chills and fever.  Respiratory:  Negative for  chest tightness, shortness of breath and wheezing.   Cardiovascular:  Negative for chest pain and palpitations.  Gastrointestinal:  Negative for abdominal pain, nausea and vomiting.       Objective    BP 137/82 (BP Location: Right Arm, Patient Position: Sitting, Cuff Size: Large)   Pulse 78   Temp 98.6 F (37 C) (Oral)   Resp 14   Wt 229 lb (103.9 kg)   SpO2 99% Comment: room air  BMI 32.86 kg/m    Physical Exam   General: Appearance:    Obese male in no acute distress  Eyes:    PERRL, conjunctiva/corneas clear, EOM's intact       Lungs:     Clear to auscultation bilaterally,  respirations unlabored  Heart:    Normal heart rate. Normal rhythm. No murmurs, rubs, or gallops.    MS:   All extremities are intact.    Neurologic:   Awake, alert, oriented x 3. No apparent focal neurological defect.         Results for orders placed or performed in visit on 03/11/22  POCT HgB A1C  Result Value Ref Range   Hemoglobin A1C 6.2 (A) 4.0 - 5.6 %   Est. average glucose Bld gHb Est-mCnc 131     Assessment & Plan     1. Type 2 diabetes mellitus with microalbuminuria, without long-term current use of insulin (HCC) Well controlled.  Continue current medications.    2. Primary hypertension Well controlled.  refill lisinopril-hydrochlorothiazide (ZESTORETIC) 20-25 MG tablet; Take 1 tablet by mouth daily.  Dispense: 90 tablet; Refill: 2  3. Coronary artery disease involving native coronary artery of native heart without angina pectoris Asymptomatic. Compliant with medication.  Continue aggressive risk factor modification.  Refill   4. Pure hypercholesterolemia He is tolerating atorvastatin well with no adverse effects.   - Lipid panel - Comprehensive metabolic panel - atorvastatin (LIPITOR) 80 MG tablet; Take 1 tablet (80 mg total) by mouth daily.  Dispense: 90 tablet; Refill: 2  5. Need for influenza vaccination  - Flu Vaccine QUAD 36+ mos IM (Fluarix/Fluzone)     The entirety of the information documented in the History of Present Illness, Review of Systems and Physical Exam were personally obtained by me. Portions of this information were initially documented by the CMA and reviewed by me for thoroughness and accuracy.     Lelon Huh, MD  [facility] [phone] (phone) [phone] (fax)  [place]

## 2022-03-12 LAB — LIPID PANEL
Chol/HDL Ratio: 3.4 ratio (ref 0.0–5.0)
Cholesterol, Total: 179 mg/dL (ref 100–199)
HDL: 53 mg/dL (ref >39)
LDL Chol Calc (NIH): 110 mg/dL — ABNORMAL HIGH (ref 0–99)
Triglycerides: 89 mg/dL (ref 0–149)
VLDL Cholesterol Cal: 16 mg/dL (ref 5–40)

## 2022-03-12 LAB — COMPREHENSIVE METABOLIC PANEL
ALT: 30 IU/L (ref 0–44)
AST: 31 IU/L (ref 0–40)
Albumin/Globulin Ratio: 2.3 — ABNORMAL HIGH (ref 1.2–2.2)
Albumin: 5 g/dL — ABNORMAL HIGH (ref 3.9–4.9)
Alkaline Phosphatase: 88 IU/L (ref 44–121)
BUN/Creatinine Ratio: 18 (ref 10–24)
BUN: 21 mg/dL (ref 8–27)
Bilirubin Total: 0.7 mg/dL (ref 0.0–1.2)
CO2: 23 mmol/L (ref 20–29)
Calcium: 9.7 mg/dL (ref 8.6–10.2)
Chloride: 99 mmol/L (ref 96–106)
Creatinine, Ser: 1.18 mg/dL (ref 0.76–1.27)
Globulin, Total: 2.2 g/dL (ref 1.5–4.5)
Glucose: 126 mg/dL — ABNORMAL HIGH (ref 70–99)
Potassium: 5.5 mmol/L — ABNORMAL HIGH (ref 3.5–5.2)
Sodium: 138 mmol/L (ref 134–144)
Total Protein: 7.2 g/dL (ref 6.0–8.5)
eGFR: 69 mL/min/{1.73_m2} (ref >59)

## 2022-04-26 ENCOUNTER — Other Ambulatory Visit: Payer: Self-pay | Admitting: Family Medicine

## 2022-04-26 DIAGNOSIS — G4700 Insomnia, unspecified: Secondary | ICD-10-CM

## 2022-05-13 ENCOUNTER — Other Ambulatory Visit: Payer: Self-pay | Admitting: Family Medicine

## 2022-05-14 LAB — HM DIABETES EYE EXAM

## 2022-05-15 ENCOUNTER — Encounter: Payer: Self-pay | Admitting: Family Medicine

## 2022-06-27 ENCOUNTER — Encounter: Payer: Self-pay | Admitting: Family Medicine

## 2022-06-27 DIAGNOSIS — B09 Unspecified viral infection characterized by skin and mucous membrane lesions: Secondary | ICD-10-CM

## 2022-07-03 MED ORDER — VALACYCLOVIR HCL 1 G PO TABS: ORAL_TABLET | ORAL | 5 refills | Status: DC

## 2022-08-16 ENCOUNTER — Other Ambulatory Visit: Payer: Self-pay | Admitting: Family Medicine

## 2022-08-16 DIAGNOSIS — G4700 Insomnia, unspecified: Secondary | ICD-10-CM

## 2022-08-16 MED ORDER — ALPRAZOLAM 0.5 MG PO TABS: 0.5000 mg | ORAL_TABLET | Freq: Every day | ORAL | 1 refills | Status: DC

## 2022-10-18 ENCOUNTER — Other Ambulatory Visit: Payer: Self-pay | Admitting: Family Medicine

## 2022-10-18 DIAGNOSIS — G4700 Insomnia, unspecified: Secondary | ICD-10-CM

## 2022-10-18 NOTE — Progress Notes (Signed)
[facility] Quality Team Note  Name: Jeffery Norman Date of Birth: 03-27-1956 MRN: [id number] Date: 10/18/2022  [facility] Quality Team has reviewed this patient's chart, please see recommendations below:  [facility] Quality Other; (PATIENT DUE FOR BLOOD PRESSURE READING (LESS THAN 140/90), A1C(LESS THAN 8.0), AND COLON CANCER SCREENING.)

## 2022-11-19 ENCOUNTER — Encounter: Payer: BC Managed Care – PPO | Admitting: Family Medicine

## 2022-12-29 ENCOUNTER — Other Ambulatory Visit: Payer: Self-pay | Admitting: Family Medicine

## 2022-12-29 DIAGNOSIS — I10 Essential (primary) hypertension: Secondary | ICD-10-CM

## 2023-01-06 ENCOUNTER — Ambulatory Visit (INDEPENDENT_AMBULATORY_CARE_PROVIDER_SITE_OTHER): Payer: BC Managed Care – PPO | Admitting: Family Medicine

## 2023-01-06 ENCOUNTER — Encounter: Payer: Self-pay | Admitting: Family Medicine

## 2023-01-06 VITALS — BP 126/76 | HR 72 | Ht 70.0 in | Wt 228.7 lb

## 2023-01-06 DIAGNOSIS — I10 Essential (primary) hypertension: Secondary | ICD-10-CM | POA: Diagnosis not present

## 2023-01-06 DIAGNOSIS — Z0000 Encounter for general adult medical examination without abnormal findings: Secondary | ICD-10-CM

## 2023-01-06 DIAGNOSIS — E1129 Type 2 diabetes mellitus with other diabetic kidney complication: Secondary | ICD-10-CM

## 2023-01-06 DIAGNOSIS — B09 Unspecified viral infection characterized by skin and mucous membrane lesions: Secondary | ICD-10-CM

## 2023-01-06 DIAGNOSIS — I2510 Atherosclerotic heart disease of native coronary artery without angina pectoris: Secondary | ICD-10-CM

## 2023-01-06 DIAGNOSIS — R809 Proteinuria, unspecified: Secondary | ICD-10-CM | POA: Diagnosis not present

## 2023-01-06 DIAGNOSIS — E7800 Pure hypercholesterolemia, unspecified: Secondary | ICD-10-CM | POA: Diagnosis not present

## 2023-01-06 DIAGNOSIS — Z125 Encounter for screening for malignant neoplasm of prostate: Secondary | ICD-10-CM | POA: Diagnosis not present

## 2023-01-06 DIAGNOSIS — Z1211 Encounter for screening for malignant neoplasm of colon: Secondary | ICD-10-CM

## 2023-01-06 DIAGNOSIS — K219 Gastro-esophageal reflux disease without esophagitis: Secondary | ICD-10-CM

## 2023-01-06 MED ORDER — ATORVASTATIN CALCIUM 80 MG PO TABS
80.0000 mg | ORAL_TABLET | Freq: Every day | ORAL | 1 refills | Status: DC
Start: 2023-01-06 — End: 2023-07-07

## 2023-01-06 MED ORDER — METFORMIN HCL 500 MG PO TABS
500.0000 mg | ORAL_TABLET | Freq: Two times a day (BID) | ORAL | 1 refills | Status: DC
Start: 2023-01-06 — End: 2023-10-13

## 2023-01-06 MED ORDER — OMEPRAZOLE 40 MG PO CPDR
40.0000 mg | DELAYED_RELEASE_CAPSULE | Freq: Every day | ORAL | 3 refills | Status: DC
Start: 2023-01-06 — End: 2024-02-08

## 2023-01-06 MED ORDER — VALACYCLOVIR HCL 1 G PO TABS
ORAL_TABLET | ORAL | 5 refills | Status: AC
Start: 2023-01-06 — End: ?

## 2023-01-06 NOTE — Patient Instructions (Addendum)
Please review the attached list of medications and notify my office if there are any errors.   Please bring all of your medications to every appointment so we can make sure that our medication list is the same as yours.   You are due for a colon cancer screening test. You can either have another colonoscopy, which is good for 10 years, or a Cologuard stool test, which is good for 3 years. Please let me know when you decide which test you prefer

## 2023-01-06 NOTE — Progress Notes (Signed)
Complete physical exam   Patient: Jeffery Norman   DOB: August 19, 1955   67 y.o. Male  MRN: [id number] Visit Date: 01/06/2023  Today's healthcare provider: Mila Merry, MD   Chief Complaint  Patient presents with   Annual Exam   Diabetes   Hypertension   Hyperlipidemia   Subjective    Jeffery Norman is a 67 y.o. male who presents today for a complete physical exam.  He reports consuming a  regular  diet.  He generally feels well. He reports sleeping fairly well. He does not have additional problems to discuss today.   He is also due to follow up hypertension, GERD, and diabetes. Doing well on current medications which he takes consistently with no adverse effects.   Past Medical History:  Diagnosis Date   Arthritis    Carpal tunnel syndrome 11/10/2008   Diabetes mellitus without complication (HCC)    GERD (gastroesophageal reflux disease)    Gout    History of chicken pox    History of measles    History of mumps    Hypertension    Past Surgical History:  Procedure Laterality Date   APPENDECTOMY  1960's   CARPAL TUNNEL RELEASE Left 10/02/2016   Procedure: CARPAL TUNNEL RELEASE;  Surgeon: Deeann Saint, MD;  Location: [facility] ORS;  Service: Orthopedics;  Laterality: Left;   index finger surgery  1981   Social History   Socioeconomic History   Marital status: Married    Spouse name: Not on file   Number of children: 1   Years of education: Not on file   Highest education level: Not on file  Occupational History   Occupation: Chartered certified accountant  Tobacco Use   Smoking status: Former    Current packs/day: 0.00    Average packs/day: 0.5 packs/day for 25.0 years (12.5 ttl pk-yrs)    Types: Cigarettes, E-cigarettes    Start date: 08/02/1986    Quit date: 08/02/2011    Years since quitting: 11.4   Smokeless tobacco: Never  Substance and Sexual Activity   Alcohol use: Yes    Alcohol/week: 10.0 standard drinks of alcohol    Types: 10 Shots of liquor per week    Comment:  moderate use   Drug use: No   Sexual activity: Not Currently  Other Topics Concern   Not on file  Social History Narrative   Not on file   Social Determinants of Health   Financial Resource Strain: Not on file  Food Insecurity: Not on file  Transportation Needs: Not on file  Physical Activity: Not on file  Stress: Not on file  Social Connections: Not on file  Intimate Partner Violence: Not on file   Family Status  Relation Name Status   Mother Raye Sorrow Deceased at age 43       septic shock   Father  Deceased at age 72       accidental   Brother Peyton Najjar Idol Alive   Son  Alive   MGM  Deceased at age 55's   Other  (Not Specified)  No partnership data on file   Family History  Problem Relation Age of Onset   Diabetes Mother    Hypertension Mother    Diabetes Brother    Heart attack Maternal Grandmother    Diabetes Other    Allergies  Allergen Reactions   Naproxen Anaphylaxis    Onset after taken one naproxen on 07-01-17, although had taken naproxen many times before and  had been on daily ECASA    Patient Care Team: Malva Limes, MD as PCP - General (Family Medicine) Debbe Odea, MD as PCP - Cardiology (Cardiology)   Medications: Outpatient Medications Prior to Visit  Medication Sig   ALPRAZolam (XANAX) 0.5 MG tablet TAKE 1 TABLET BY MOUTH AT BEDTIME   aspirin EC 81 MG tablet Take 81 mg by mouth daily. Swallow whole.   atorvastatin (LIPITOR) 80 MG tablet Take 1 tablet (80 mg total) by mouth daily.   Cholecalciferol (VITAMIN D3) 25 MCG (1000 UT) CAPS Take 1 capsule by mouth daily.   Coenzyme Q10 100 MG capsule Take 100 mg by mouth daily.   cyanocobalamin 1000 MCG tablet Take 1,000 mcg by mouth daily.   glucose blood test strip 1 strip daily.    lisinopril-hydrochlorothiazide (ZESTORETIC) 20-25 MG tablet TAKE 1 TABLET BY MOUTH DAILY   metFORMIN (GLUCOPHAGE) 500 MG tablet TAKE ONE TABLET BY MOUTH TWICE A DAY   Omega-3 Fatty Acids (FISH  OIL) 1000 MG CAPS Take 2 capsules by mouth daily.   omeprazole (PRILOSEC) 40 MG capsule TAKE ONE CAPSULE BY MOUTH DAILY   Potassium 99 MG TABS Take 2 tablets by mouth daily.   sildenafil (REVATIO) 20 MG tablet TAKE 3-5 TABLETS AS NEEDED PRIOR TO INTERCOURSE. NOT TO EXCEED 5 TABLETS IN A DAY.   valACYclovir (VALTREX) 1000 MG tablet TAKE ONE TABLET BY MOUTH EVERY 8 HOURS FOR UP TO 7 DAYS AS NEEDED   No facility-administered medications prior to visit.    Review of Systems    Objective    BP 126/76   Pulse 72   Ht 5\' 10"  (1.778 m)   Wt 228 lb 11.2 oz (103.7 kg)   SpO2 97%   BMI 32.82 kg/m    Physical Exam   General Appearance:    Mildly obese male. Alert, cooperative, in no acute distress, appears stated age  Head:    Normocephalic, without obvious abnormality, atraumatic  Eyes:    PERRL, conjunctiva/corneas clear, EOM's intact, fundi    benign, both eyes       Ears:    Normal TM's and external ear canals, both ears  Nose:   Nares normal, septum midline, mucosa normal, no drainage   or sinus tenderness  Throat:   Lips, mucosa, and tongue normal; teeth and gums normal  Neck:   Supple, symmetrical, trachea midline, no adenopathy;       thyroid:  No enlargement/tenderness/nodules; no carotid   bruit or JVD  Back:     Symmetric, no curvature, ROM normal, no CVA tenderness  Lungs:     Clear to auscultation bilaterally, respirations unlabored  Chest wall:    No tenderness or deformity  Heart:    Normal heart rate. Normal rhythm. No murmurs, rubs, or gallops.  S1 and S2 normal  Abdomen:     Soft, non-tender, bowel sounds active all four quadrants,    no masses, no organomegaly  Genitalia:    deferred  Rectal:    deferred  Extremities:   All extremities are intact. No cyanosis or edema  Pulses:   2+ and symmetric all extremities  Skin:   Skin color, texture, turgor normal, no rashes or lesions  Lymph nodes:   Cervical, supraclavicular, and axillary nodes normal  Neurologic:    CNII-XII intact. Normal strength, sensation and reflexes      throughout     Last depression screening scores    01/06/2023    8:49 AM 11/07/2021  9:11 AM 11/01/2020    9:05 AM  PHQ 2/9 Scores  PHQ - 2 Score 0 0 0  PHQ- 9 Score  3 0   Last fall risk screening    01/06/2023    8:49 AM  Fall Risk   Falls in the past year? 0  Injury with Fall? 0  Risk for fall due to : No Fall Risks  Follow up Falls evaluation completed   Last Audit-C alcohol use screening    11/07/2021    9:11 AM  Alcohol Use Disorder Test (AUDIT)  1. How often do you have a drink containing alcohol? 3  2. How many drinks containing alcohol do you have on a typical day when you are drinking? 1  3. How often do you have six or more drinks on one occasion? 0  AUDIT-C Score 4  4. How often during the last year have you found that you were not able to stop drinking once you had started? 0  5. How often during the last year have you failed to do what was normally expected from you because of drinking? 0  6. How often during the last year have you needed a first drink in the morning to get yourself going after a heavy drinking session? 0  7. How often during the last year have you had a feeling of guilt of remorse after drinking? 0  8. How often during the last year have you been unable to remember what happened the night before because you had been drinking? 0  9. Have you or someone else been injured as a result of your drinking? 0  10. Has a relative or friend or a doctor or another health worker been concerned about your drinking or suggested you cut down? 0  Alcohol Use Disorder Identification Test Final Score (AUDIT) 4   A score of 3 or more in women, and 4 or more in men indicates increased risk for alcohol abuse, EXCEPT if all of the points are from question 1   No results found for any visits on 01/06/23.  Assessment & Plan    Routine Health Maintenance and Physical Exam  Exercise Activities and Dietary  recommendations  Goals   None     Immunization History  Administered Date(s) Administered   Influenza,inj,Quad PF,6+ Mos 03/26/2019, 03/11/2022   Influenza-Unspecified 04/03/2017   Pneumococcal Polysaccharide-23 05/09/2017   Td 03/28/1997   Tdap 10/22/2007, 10/16/2018   Zoster Recombinant(Shingrix) 10/16/2018, 03/26/2019    Health Maintenance  Topic Date Due   COVID-19 Vaccine (1 - 2023-24 season) Never done   Colonoscopy  05/14/2022   HEMOGLOBIN A1C  09/10/2022   Diabetic kidney evaluation - Urine ACR  11/08/2022   INFLUENZA VACCINE  01/02/2023   Diabetic kidney evaluation - eGFR measurement  03/12/2023   OPHTHALMOLOGY EXAM  05/15/2023   DTaP/Tdap/Td (4 - Td or Tdap) 10/15/2028   Hepatitis C Screening  Completed   HIV Screening  Completed   Zoster Vaccines- Shingrix  Completed   HPV VACCINES  Aged Out    Discussed health benefits of physical activity, and encouraged him to engage in regular exercise appropriate for his age and condition.   2. Prostate cancer screening  - PSA Total (Reflex To Free)  3. Colon cancer screening Counseled on options for screening and advised to let me know which he prefers.   4. Type 2 diabetes mellitus with microalbuminuria, without long-term current use of insulin (HCC)  - Urine microalbumin-creatinine with uACR -  HgB A1c - Comprehensive metabolic panel  5. Primary hypertension Well controlled.  Continue current medications.   - Comprehensive metabolic panel  6. Pure hypercholesterolemia He is tolerating atorvastatin well with no adverse effects.   - Lipid panel - CBC - TSH - atorvastatin (LIPITOR) 80 MG tablet; Take 1 tablet (80 mg total) by mouth daily.  Dispense: 90 tablet; Refill: 1  7. Coronary artery disease involving native coronary artery of native heart without angina pectoris Asymptomatic. Compliant with medication.  Continue aggressive risk factor modification.  Has not had follow up with cardiologist within the  last year.  - EKG 12-Lead - atorvastatin (LIPITOR) 80 MG tablet; Take 1 tablet (80 mg total) by mouth daily.  Dispense: 90 tablet; Refill: 1  8. Gastroesophageal reflux disease without esophagitis Continue on omeprazole which is working well.   9. Viral exanthem refill - valACYclovir (VALTREX) 1000 MG tablet; TAKE ONE TABLET BY MOUTH EVERY 8 HOURS FOR UP TO 7 DAYS AS NEEDED  Dispense: 42 tablet; Refill: 5         Mila Merry, MD  [facility] [phone] (phone) [phone] (fax)  [facility]

## 2023-01-07 ENCOUNTER — Encounter: Payer: Self-pay | Admitting: Family Medicine

## 2023-01-07 DIAGNOSIS — Z1211 Encounter for screening for malignant neoplasm of colon: Secondary | ICD-10-CM

## 2023-01-13 DIAGNOSIS — Z1211 Encounter for screening for malignant neoplasm of colon: Secondary | ICD-10-CM | POA: Diagnosis not present

## 2023-03-21 ENCOUNTER — Other Ambulatory Visit: Payer: Self-pay | Admitting: Family Medicine

## 2023-03-21 DIAGNOSIS — G4700 Insomnia, unspecified: Secondary | ICD-10-CM

## 2023-03-28 DIAGNOSIS — M1712 Unilateral primary osteoarthritis, left knee: Secondary | ICD-10-CM | POA: Diagnosis not present

## 2023-05-16 DIAGNOSIS — H5203 Hypermetropia, bilateral: Secondary | ICD-10-CM | POA: Diagnosis not present

## 2023-05-16 DIAGNOSIS — H35033 Hypertensive retinopathy, bilateral: Secondary | ICD-10-CM | POA: Diagnosis not present

## 2023-05-16 DIAGNOSIS — H2513 Age-related nuclear cataract, bilateral: Secondary | ICD-10-CM | POA: Diagnosis not present

## 2023-05-16 DIAGNOSIS — H40013 Open angle with borderline findings, low risk, bilateral: Secondary | ICD-10-CM | POA: Diagnosis not present

## 2023-05-16 DIAGNOSIS — E119 Type 2 diabetes mellitus without complications: Secondary | ICD-10-CM | POA: Diagnosis not present

## 2023-05-16 LAB — HM DIABETES EYE EXAM

## 2023-06-27 ENCOUNTER — Other Ambulatory Visit: Payer: Self-pay | Admitting: Family Medicine

## 2023-06-27 DIAGNOSIS — I10 Essential (primary) hypertension: Secondary | ICD-10-CM

## 2023-07-05 ENCOUNTER — Other Ambulatory Visit: Payer: Self-pay | Admitting: Family Medicine

## 2023-07-05 DIAGNOSIS — I2510 Atherosclerotic heart disease of native coronary artery without angina pectoris: Secondary | ICD-10-CM

## 2023-07-05 DIAGNOSIS — E7800 Pure hypercholesterolemia, unspecified: Secondary | ICD-10-CM

## 2023-07-07 NOTE — Telephone Encounter (Signed)
Requested Prescriptions  Pending Prescriptions Disp Refills   atorvastatin (LIPITOR) 80 MG tablet [Pharmacy Med Name: ATORVASTATIN 80 MG TABLET] 90 tablet 1    Sig: TAKE 1 TABLET BY MOUTH DAILY     Cardiovascular:  Antilipid - Statins Failed - 07/07/2023  1:27 PM      Failed - Lipid Panel in normal range within the last 12 months    Cholesterol, Total  Date Value Ref Range Status  01/06/2023 146 100 - 199 mg/dL Final   LDL Cholesterol (Calc)  Date Value Ref Range Status  05/09/2017 99 mg/dL (calc) Final    Comment:    Reference range: <100 . Desirable range <100 mg/dL for primary prevention;   <70 mg/dL for patients with CHD or diabetic patients  with > or = 2 CHD risk factors. Marland Kitchen LDL-C is now calculated using the Martin-Hopkins  calculation, which is a validated novel method providing  better accuracy than the Friedewald equation in the  estimation of LDL-C.  Horald Pollen et al. Lenox Ahr. 4098;119(14): 2061-2068  (http://education.[employer].com/faq/FAQ164)    LDL Chol Calc (NIH)  Date Value Ref Range Status  01/06/2023 86 0 - 99 mg/dL Final   HDL  Date Value Ref Range Status  01/06/2023 43 >39 mg/dL Final   Triglycerides  Date Value Ref Range Status  01/06/2023 88 0 - 149 mg/dL Final         Passed - Patient is not pregnant      Passed - Valid encounter within last 12 months    Recent Outpatient Visits           6 months ago Annual physical exam   [place] [facility] Malva Limes, MD   1 year ago Type 2 diabetes mellitus with microalbuminuria, without long-term current use of insulin [facility])   [place] [facility] Malva Limes, MD   1 year ago Annual physical exam   [place] [facility] Malva Limes, MD   2 years ago Annual physical exam   [facility] Malva Limes, MD   2 years ago Type 2 diabetes mellitus with microalbuminuria, without long-term current use of  insulin [facility])    [facility] Sherrie Mustache, Demetrios Isaacs, MD

## 2023-07-08 DIAGNOSIS — M9904 Segmental and somatic dysfunction of sacral region: Secondary | ICD-10-CM | POA: Diagnosis not present

## 2023-07-08 DIAGNOSIS — M9902 Segmental and somatic dysfunction of thoracic region: Secondary | ICD-10-CM | POA: Diagnosis not present

## 2023-07-08 DIAGNOSIS — M9903 Segmental and somatic dysfunction of lumbar region: Secondary | ICD-10-CM | POA: Diagnosis not present

## 2023-07-08 DIAGNOSIS — M9901 Segmental and somatic dysfunction of cervical region: Secondary | ICD-10-CM | POA: Diagnosis not present

## 2023-07-22 DIAGNOSIS — M9902 Segmental and somatic dysfunction of thoracic region: Secondary | ICD-10-CM | POA: Diagnosis not present

## 2023-07-22 DIAGNOSIS — M9901 Segmental and somatic dysfunction of cervical region: Secondary | ICD-10-CM | POA: Diagnosis not present

## 2023-07-22 DIAGNOSIS — M9904 Segmental and somatic dysfunction of sacral region: Secondary | ICD-10-CM | POA: Diagnosis not present

## 2023-07-22 DIAGNOSIS — M9903 Segmental and somatic dysfunction of lumbar region: Secondary | ICD-10-CM | POA: Diagnosis not present

## 2023-07-23 ENCOUNTER — Other Ambulatory Visit: Payer: Self-pay | Admitting: Family Medicine

## 2023-07-23 DIAGNOSIS — G4700 Insomnia, unspecified: Secondary | ICD-10-CM

## 2023-08-05 DIAGNOSIS — M9903 Segmental and somatic dysfunction of lumbar region: Secondary | ICD-10-CM | POA: Diagnosis not present

## 2023-08-05 DIAGNOSIS — M9904 Segmental and somatic dysfunction of sacral region: Secondary | ICD-10-CM | POA: Diagnosis not present

## 2023-08-05 DIAGNOSIS — M9901 Segmental and somatic dysfunction of cervical region: Secondary | ICD-10-CM | POA: Diagnosis not present

## 2023-08-05 DIAGNOSIS — M9902 Segmental and somatic dysfunction of thoracic region: Secondary | ICD-10-CM | POA: Diagnosis not present

## 2023-08-18 DIAGNOSIS — M9901 Segmental and somatic dysfunction of cervical region: Secondary | ICD-10-CM | POA: Diagnosis not present

## 2023-08-18 DIAGNOSIS — M9902 Segmental and somatic dysfunction of thoracic region: Secondary | ICD-10-CM | POA: Diagnosis not present

## 2023-08-18 DIAGNOSIS — M9904 Segmental and somatic dysfunction of sacral region: Secondary | ICD-10-CM | POA: Diagnosis not present

## 2023-08-18 DIAGNOSIS — M9903 Segmental and somatic dysfunction of lumbar region: Secondary | ICD-10-CM | POA: Diagnosis not present

## 2023-10-11 ENCOUNTER — Other Ambulatory Visit: Payer: Self-pay | Admitting: Family Medicine

## 2023-10-11 DIAGNOSIS — E1129 Type 2 diabetes mellitus with other diabetic kidney complication: Secondary | ICD-10-CM

## 2023-10-13 NOTE — Telephone Encounter (Signed)
 2nd Request..[facility] faxed refill request for the following medications:   metFORMIN  (GLUCOPHAGE ) 500 MG tablet    Please advise

## 2024-01-04 ENCOUNTER — Other Ambulatory Visit: Payer: Self-pay | Admitting: Family Medicine

## 2024-01-04 DIAGNOSIS — E7800 Pure hypercholesterolemia, unspecified: Secondary | ICD-10-CM

## 2024-01-04 DIAGNOSIS — I2510 Atherosclerotic heart disease of native coronary artery without angina pectoris: Secondary | ICD-10-CM

## 2024-02-01 ENCOUNTER — Other Ambulatory Visit: Payer: Self-pay | Admitting: Family Medicine

## 2024-02-01 DIAGNOSIS — G4700 Insomnia, unspecified: Secondary | ICD-10-CM

## 2024-02-08 ENCOUNTER — Other Ambulatory Visit: Payer: Self-pay | Admitting: Family Medicine

## 2024-02-08 DIAGNOSIS — K219 Gastro-esophageal reflux disease without esophagitis: Secondary | ICD-10-CM

## 2024-02-09 ENCOUNTER — Encounter: Payer: Self-pay | Admitting: Family Medicine

## 2024-02-09 ENCOUNTER — Ambulatory Visit (INDEPENDENT_AMBULATORY_CARE_PROVIDER_SITE_OTHER): Admitting: Family Medicine

## 2024-02-09 VITALS — BP 125/83 | HR 81 | Temp 98.7°F | Resp 16 | Ht 70.0 in | Wt 230.3 lb

## 2024-02-09 DIAGNOSIS — Z0000 Encounter for general adult medical examination without abnormal findings: Secondary | ICD-10-CM

## 2024-02-09 DIAGNOSIS — H9313 Tinnitus, bilateral: Secondary | ICD-10-CM

## 2024-02-09 DIAGNOSIS — H9312 Tinnitus, left ear: Secondary | ICD-10-CM

## 2024-02-09 DIAGNOSIS — R809 Proteinuria, unspecified: Secondary | ICD-10-CM | POA: Diagnosis not present

## 2024-02-09 DIAGNOSIS — Z0001 Encounter for general adult medical examination with abnormal findings: Secondary | ICD-10-CM | POA: Diagnosis not present

## 2024-02-09 DIAGNOSIS — G4700 Insomnia, unspecified: Secondary | ICD-10-CM

## 2024-02-09 DIAGNOSIS — K219 Gastro-esophageal reflux disease without esophagitis: Secondary | ICD-10-CM | POA: Diagnosis not present

## 2024-02-09 DIAGNOSIS — E7800 Pure hypercholesterolemia, unspecified: Secondary | ICD-10-CM

## 2024-02-09 DIAGNOSIS — I10 Essential (primary) hypertension: Secondary | ICD-10-CM | POA: Diagnosis not present

## 2024-02-09 DIAGNOSIS — Z125 Encounter for screening for malignant neoplasm of prostate: Secondary | ICD-10-CM

## 2024-02-09 DIAGNOSIS — E1129 Type 2 diabetes mellitus with other diabetic kidney complication: Secondary | ICD-10-CM | POA: Diagnosis not present

## 2024-02-09 NOTE — Progress Notes (Signed)
 Subjective:    Jeffery Norman is a 68 y.o. male who presents for a Welcome to Medicare exam.   Cardiac Risk Factors include: advanced age (>77men, >93 women);male gender;obesity (BMI >30kg/m2);dyslipidemia;diabetes mellitus;hypertension  Patient is also due for follow up type 2 diabetes with microalbuminuria GERD, and hyperlipidemia. Feels well, no complaints today. Taking medications consistently and tolerating without adverse effects. He also has occasional trouble sleeping and takes alprazolam  a few times per week.   Lab Results  Component Value Date   CHOL 146 01/06/2023   HDL 43 01/06/2023   LDLCALC 86 01/06/2023   TRIG 88 01/06/2023   CHOLHDL 3.4 01/06/2023   Lab Results  Component Value Date   HGBA1C 6.6 (H) 01/06/2023   HGBA1C 6.2 (A) 03/11/2022   HGBA1C 6.3 (H) 11/07/2021   Lab Results  Component Value Date   NA 139 01/06/2023   K 4.4 01/06/2023   CREATININE 1.23 01/06/2023   EGFR 66 01/06/2023   GLUCOSE 123 (H) 01/06/2023   Lab Results  Component Value Date   LABMICR CANCELED 01/06/2023   LABMICR 7.1 11/07/2021   MICROALBUR 50 10/16/2018   MICROALBUR 20 05/09/2017         Objective:    Today's Vitals   02/09/24 0910  BP: 125/83  Pulse: 81  Resp: 16  Temp: 98.7 F (37.1 C)  TempSrc: Oral  SpO2: 97%  Weight: 230 lb 4.8 oz (104.5 kg)  Height: 5' 10 (1.778 m)   Body mass index is 33.04 kg/m.  Medications Outpatient Encounter Medications as of 02/09/2024  Medication Sig   ALPRAZolam  (XANAX ) 0.5 MG tablet TAKE 1 TABLET BY MOUTH AT BEDTIME   aspirin  EC 81 MG tablet Take 81 mg by mouth daily. Swallow whole.   atorvastatin  (LIPITOR) 80 MG tablet TAKE 1 TABLET BY MOUTH DAILY   Cholecalciferol (VITAMIN D3) 25 MCG (1000 UT) CAPS Take 1 capsule by mouth daily.   Coenzyme Q10 100 MG capsule Take 100 mg by mouth daily.   cyanocobalamin 1000 MCG tablet Take 1,000 mcg by mouth daily.   glucose blood test strip 1 strip daily.     lisinopril -hydrochlorothiazide  (ZESTORETIC ) 20-25 MG tablet TAKE 1 TABLET BY MOUTH DAILY   metFORMIN  (GLUCOPHAGE ) 500 MG tablet TAKE 1 TABLET BY MOUTH 2 TIMES A DAY   Omega-3 Fatty Acids (FISH OIL) 1000 MG CAPS Take 2 capsules by mouth daily.   omeprazole  (PRILOSEC) 40 MG capsule TAKE 1 CAPSULE BY MOUTH DAILY   Potassium 99 MG TABS Take 2 tablets by mouth daily.   sildenafil  (REVATIO ) 20 MG tablet TAKE 3-5 TABLETS AS NEEDED PRIOR TO INTERCOURSE. NOT TO EXCEED 5 TABLETS IN A DAY.   valACYclovir  (VALTREX ) 1000 MG tablet TAKE ONE TABLET BY MOUTH EVERY 8 HOURS FOR UP TO 7 DAYS AS NEEDED   No facility-administered encounter medications on file as of 02/09/2024.     History: Past Medical History:  Diagnosis Date   Arthritis    Carpal tunnel syndrome 11/10/2008   Diabetes mellitus without complication (HCC)    GERD (gastroesophageal reflux disease)    Gout    History of chicken pox    History of measles    History of mumps    Hypertension    Past Surgical History:  Procedure Laterality Date   APPENDECTOMY  1960's   CARPAL TUNNEL RELEASE Left 10/02/2016   Procedure: CARPAL TUNNEL RELEASE;  Surgeon: Kayla Pinal, MD;  Location: [facility] ORS;  Service: Orthopedics;  Laterality: Left;   index  finger surgery  1981    Family History  Problem Relation Age of Onset   Diabetes Mother    Hypertension Mother    Diabetes Brother    Heart attack Maternal Grandmother    Diabetes Other    Social History   Occupational History   Occupation: Chartered certified accountant  Tobacco Use   Smoking status: Former    Current packs/day: 0.00    Average packs/day: 0.5 packs/day for 25.0 years (12.5 ttl pk-yrs)    Types: Cigarettes, E-cigarettes    Start date: 08/02/1986    Quit date: 08/02/2011    Years since quitting: 12.5   Smokeless tobacco: Never  Substance and Sexual Activity   Alcohol use: Yes    Alcohol/week: 10.0 standard drinks of alcohol    Types: 10 Shots of liquor per week    Comment: moderate use   Drug use:  No   Sexual activity: Not Currently    Tobacco Counseling Counseling given: Not Answered   Immunizations and Health Maintenance Immunization History  Administered Date(s) Administered   Influenza,inj,Quad PF,6+ Mos 03/26/2019, 03/11/2022   Influenza-Unspecified 04/03/2017   Pneumococcal Polysaccharide-23 05/09/2017   Td 03/28/1997   Tdap 10/22/2007, 10/16/2018   Zoster Recombinant(Shingrix ) 10/16/2018, 03/26/2019   Health Maintenance Due  Topic Date Due   Pneumococcal Vaccine: 50+ Years (2 of 2 - PCV) 05/09/2018   HEMOGLOBIN A1C  07/09/2023   Influenza Vaccine  01/02/2024   Diabetic kidney evaluation - eGFR measurement  01/06/2024   Diabetic kidney evaluation - Urine ACR  01/06/2024   COVID-19 Vaccine (1 - 2024-25 season) Never done    Activities of Daily Living    02/09/2024    9:10 AM 02/06/2024   11:00 AM  In your present state of health, do you have any difficulty performing the following activities:  Hearing? 0 0   Vision? 0 0   Difficulty concentrating or making decisions? 0 0   Walking or climbing stairs? 0 0   Dressing or bathing? 0 0   Doing errands, shopping? 0 0   Preparing Food and eating ? N N   Using the Toilet? N N   In the past six months, have you accidently leaked urine? N N   Do you have problems with loss of bowel control? N N   Managing your Medications? N N   Managing your Finances? N N   Housekeeping or managing your Housekeeping? N N      Proxy-reported    Physical Exam   General Appearance:    Mildly obese male. Alert, cooperative, in no acute distress, appears stated age  Head:    Normocephalic, without obvious abnormality, atraumatic  Eyes:    PERRL, conjunctiva/corneas clear, EOM's intact, fundi    benign, both eyes       Ears:    Normal TM's and external ear canals, both ears  Nose:   Nares normal, septum midline, mucosa normal, no drainage   or sinus tenderness  Throat:   Lips, mucosa, and tongue normal; teeth and gums normal   Neck:   Supple, symmetrical, trachea midline, no adenopathy;       thyroid :  No enlargement/tenderness/nodules; no carotid   bruit or JVD  Back:     Symmetric, no curvature, ROM normal, no CVA tenderness  Lungs:     Clear to auscultation bilaterally, respirations unlabored  Chest wall:    No tenderness or deformity  Heart:    Normal heart rate. Normal rhythm. No murmurs, rubs, or gallops.  S1 and S2 normal  Abdomen:     Soft, non-tender, bowel sounds active all four quadrants,    no masses, no organomegaly  Genitalia:    deferred  Rectal:    deferred  Extremities:   All extremities are intact. No cyanosis or edema  Pulses:   2+ and symmetric all extremities  Skin:   Skin color, texture, turgor normal, no rashes or lesions  Lymph nodes:   Cervical, supraclavicular, and axillary nodes normal  Neurologic:   CNII-XII intact. Normal strength, sensation and reflexes      throughout      Advanced Directives: Does Patient Have a Medical Advance Directive?: No   EKG:  unchanged from previous tracings     Assessment:    This is a routine wellness  examination for this patient .   Vision/Hearing screen Vision Screening   Right eye Left eye Both eyes  Without correction     With correction 20/25 20/25 20/20      Goals   None      Depression Screen    02/09/2024    9:09 AM 01/06/2023    8:49 AM 11/07/2021    9:11 AM 11/01/2020    9:05 AM  PHQ 2/9 Scores  PHQ - 2 Score 0 0 0 0  PHQ- 9 Score   3 0     Fall Risk    02/09/2024    9:09 AM  Fall Risk   Falls in the past year? 0  Number falls in past yr: 0  Injury with Fall? 0  Risk for fall due to : No Fall Risks    Cognitive Function        02/09/2024    9:18 AM  6CIT Screen  What Year? 0 points  What month? 0 points  What time? 0 points  Count back from 20 0 points  Months in reverse 0 points  Repeat phrase 0 points  Total Score 0 points    Patient Care Team: Gasper Nancyann BRAVO, MD as PCP - General (Family  Medicine) Darliss Rogue, MD as PCP - Cardiology (Cardiology)     Plan:     I have personally reviewed and noted the following in the patient's chart:   Medical and social history Use of alcohol, tobacco or illicit drugs  Current medications and supplements including opioid prescriptions. Patient is not currently taking opioid prescriptions. Functional ability and status Nutritional status Physical activity Advanced directives List of other physicians Hospitalizations, surgeries, and ER visits in previous 12 months Vitals Screenings to include cognitive, depression, and falls Referrals and appointments  In addition, I have reviewed and discussed with patient certain preventive protocols, quality metrics, and best practice recommendations. A written personalized care plan for preventive services as well as general preventive health recommendations were provided to patient.   2. Type 2 diabetes mellitus with microalbuminuria, without long-term current use of insulin (HCC) Doing well with current dose of metformin    - Renal function panel [LAB19] - Hemoglobin A1c  3. Primary hypertension Well controlled.  Continue current medications.   - EKG 12-Lead  4. Pure hypercholesterolemia He is tolerating atorvastatin  well with no adverse effects.   - EKG 12-Lead - CBC - Comprehensive metabolic panel with GFR - Lipid panel  5. Prostate cancer screening  - PSA Total (Reflex To Free)  6. Tinnitus  Long-standing. Slightly worse over the last year.   7. Insomnia, unspecified type Continue prn alprazolam .   8. Gastroesophageal reflux disease without esophagitis  Well controlled on current dose of omperazole.       Nancyann Perry, MD 02/09/2024

## 2024-02-09 NOTE — Patient Instructions (Signed)
 Jeffery Norman  Please review the attached list of medications and notify my office if there are any errors.   . Please bring all of your medications to every appointment so we can make sure that our medication list is the same as yours.

## 2024-02-10 ENCOUNTER — Ambulatory Visit: Payer: Self-pay | Admitting: Family Medicine

## 2024-02-10 LAB — HEMOGLOBIN A1C
Est. average glucose Bld gHb Est-mCnc: 123 mg/dL
Hgb A1c MFr Bld: 5.9 % — ABNORMAL HIGH (ref 4.8–5.6)

## 2024-02-10 LAB — COMPREHENSIVE METABOLIC PANEL WITH GFR
ALT: 31 IU/L (ref 0–44)
AST: 27 IU/L (ref 0–40)
Albumin: 4.8 g/dL (ref 3.9–4.9)
Alkaline Phosphatase: 84 IU/L (ref 44–121)
BUN/Creatinine Ratio: 19 (ref 10–24)
BUN: 23 mg/dL (ref 8–27)
Bilirubin Total: 0.7 mg/dL (ref 0.0–1.2)
CO2: 22 mmol/L (ref 20–29)
Calcium: 9.8 mg/dL (ref 8.6–10.2)
Chloride: 98 mmol/L (ref 96–106)
Creatinine, Ser: 1.22 mg/dL (ref 0.76–1.27)
Globulin, Total: 2.1 g/dL (ref 1.5–4.5)
Glucose: 128 mg/dL — ABNORMAL HIGH (ref 70–99)
Potassium: 5.2 mmol/L (ref 3.5–5.2)
Sodium: 135 mmol/L (ref 134–144)
Total Protein: 6.9 g/dL (ref 6.0–8.5)
eGFR: 66 mL/min/1.73 (ref >59)

## 2024-02-10 LAB — CBC
Hematocrit: 42.8 % (ref 37.5–51.0)
Hemoglobin: 14.3 g/dL (ref 13.0–17.7)
MCH: 31.6 pg (ref 26.6–33.0)
MCHC: 33.4 g/dL (ref 31.5–35.7)
MCV: 95 fL (ref 79–97)
Platelets: 203 x10E3/uL (ref 150–450)
RBC: 4.52 x10E6/uL (ref 4.14–5.80)
RDW: 14.1 % (ref 11.6–15.4)
WBC: 6.6 x10E3/uL (ref 3.4–10.8)

## 2024-02-10 LAB — LIPID PANEL
Chol/HDL Ratio: 3.7 ratio (ref 0.0–5.0)
Cholesterol, Total: 172 mg/dL (ref 100–199)
HDL: 46 mg/dL (ref >39)
LDL Chol Calc (NIH): 109 mg/dL — ABNORMAL HIGH (ref 0–99)
Triglycerides: 93 mg/dL (ref 0–149)
VLDL Cholesterol Cal: 17 mg/dL (ref 5–40)

## 2024-02-10 LAB — PSA TOTAL (REFLEX TO FREE): Prostate Specific Ag, Serum: 1.6 ng/mL (ref 0.0–4.0)

## 2024-02-10 LAB — RENAL FUNCTION PANEL: Phosphorus: 3.2 mg/dL (ref 2.8–4.1)

## 2024-02-18 ENCOUNTER — Other Ambulatory Visit: Payer: Self-pay | Admitting: Family Medicine

## 2024-02-18 DIAGNOSIS — G4700 Insomnia, unspecified: Secondary | ICD-10-CM

## 2024-04-07 ENCOUNTER — Other Ambulatory Visit: Payer: Self-pay | Admitting: Family Medicine

## 2024-04-07 DIAGNOSIS — E1129 Type 2 diabetes mellitus with other diabetic kidney complication: Secondary | ICD-10-CM

## 2024-04-22 NOTE — Progress Notes (Signed)
 Jeffery Norman                                          MRN: [id number]   04/22/2024   The [facility] Quality Team Specialist reviewed this patient medical record for the purposes of chart review for care gap closure. The following were reviewed: chart review for care gap closure-kidney health evaluation for diabetes:eGFR  and uACR.    [facility] Quality Team

## 2024-05-05 ENCOUNTER — Other Ambulatory Visit: Payer: Self-pay | Admitting: Family Medicine

## 2024-05-05 DIAGNOSIS — K219 Gastro-esophageal reflux disease without esophagitis: Secondary | ICD-10-CM

## 2024-05-14 DIAGNOSIS — H2513 Age-related nuclear cataract, bilateral: Secondary | ICD-10-CM | POA: Diagnosis not present

## 2024-05-14 DIAGNOSIS — E119 Type 2 diabetes mellitus without complications: Secondary | ICD-10-CM | POA: Diagnosis not present

## 2024-05-14 DIAGNOSIS — H5203 Hypermetropia, bilateral: Secondary | ICD-10-CM | POA: Diagnosis not present

## 2024-05-14 DIAGNOSIS — H40013 Open angle with borderline findings, low risk, bilateral: Secondary | ICD-10-CM | POA: Diagnosis not present

## 2024-05-14 DIAGNOSIS — H35033 Hypertensive retinopathy, bilateral: Secondary | ICD-10-CM | POA: Diagnosis not present

## 2024-05-14 LAB — OPHTHALMOLOGY REPORT-SCANNED

## 2024-06-23 ENCOUNTER — Other Ambulatory Visit: Payer: Self-pay | Admitting: Family Medicine

## 2024-06-23 DIAGNOSIS — I10 Essential (primary) hypertension: Secondary | ICD-10-CM

## 2024-07-16 ENCOUNTER — Ambulatory Visit: Admitting: Cardiology
